# Patient Record
Sex: Female | Born: 1940 | Race: White | Hispanic: No | Marital: Married | Smoking: Never smoker
Health system: Southern US, Community
[De-identification: ages and names within clinical notes are randomized; demographics above are authoritative.]

## PROBLEM LIST (undated history)

## (undated) DIAGNOSIS — E119 Type 2 diabetes mellitus without complications: Secondary | ICD-10-CM

## (undated) DIAGNOSIS — M1990 Unspecified osteoarthritis, unspecified site: Secondary | ICD-10-CM

## (undated) DIAGNOSIS — D649 Anemia, unspecified: Secondary | ICD-10-CM

## (undated) DIAGNOSIS — F329 Major depressive disorder, single episode, unspecified: Secondary | ICD-10-CM

## (undated) DIAGNOSIS — R519 Headache, unspecified: Secondary | ICD-10-CM

## (undated) DIAGNOSIS — R51 Headache: Secondary | ICD-10-CM

## (undated) DIAGNOSIS — G459 Transient cerebral ischemic attack, unspecified: Secondary | ICD-10-CM

## (undated) DIAGNOSIS — K219 Gastro-esophageal reflux disease without esophagitis: Secondary | ICD-10-CM

## (undated) DIAGNOSIS — I639 Cerebral infarction, unspecified: Secondary | ICD-10-CM

## (undated) DIAGNOSIS — I10 Essential (primary) hypertension: Secondary | ICD-10-CM

## (undated) DIAGNOSIS — F32A Depression, unspecified: Secondary | ICD-10-CM

## (undated) HISTORY — PX: COLONOSCOPY: SHX174

## (undated) HISTORY — PX: TUBAL LIGATION: SHX77

## (undated) HISTORY — PX: HERNIA REPAIR: SHX51

## (undated) HISTORY — PX: ABDOMINAL HYSTERECTOMY: SHX81

## (undated) HISTORY — PX: ESOPHAGOGASTRODUODENOSCOPY: SHX1529

## (undated) HISTORY — PX: CHOLECYSTECTOMY: SHX55

## (undated) HISTORY — PX: ROTATOR CUFF REPAIR: SHX139

## (undated) HISTORY — PX: CATARACT EXTRACTION: SUR2

---

## 2004-07-19 ENCOUNTER — Ambulatory Visit: Payer: Self-pay | Admitting: Obstetrics and Gynecology

## 2005-02-01 ENCOUNTER — Ambulatory Visit: Payer: Self-pay | Admitting: Gastroenterology

## 2006-07-12 ENCOUNTER — Ambulatory Visit: Payer: Self-pay | Admitting: Unknown Physician Specialty

## 2007-07-21 ENCOUNTER — Ambulatory Visit: Payer: Self-pay | Admitting: Internal Medicine

## 2009-05-04 ENCOUNTER — Ambulatory Visit: Payer: Self-pay | Admitting: Internal Medicine

## 2009-12-21 ENCOUNTER — Ambulatory Visit: Payer: Self-pay | Admitting: Family Medicine

## 2009-12-31 ENCOUNTER — Ambulatory Visit: Payer: Self-pay | Admitting: Family Medicine

## 2010-01-13 ENCOUNTER — Ambulatory Visit: Payer: Self-pay | Admitting: Family Medicine

## 2010-05-17 ENCOUNTER — Ambulatory Visit: Payer: Self-pay | Admitting: Neurology

## 2010-12-06 ENCOUNTER — Ambulatory Visit: Payer: Self-pay | Admitting: Family Medicine

## 2011-01-15 ENCOUNTER — Ambulatory Visit: Payer: Self-pay | Admitting: Unknown Physician Specialty

## 2011-03-13 ENCOUNTER — Ambulatory Visit: Payer: Self-pay | Admitting: Family Medicine

## 2011-04-18 ENCOUNTER — Ambulatory Visit: Payer: Self-pay | Admitting: Gastroenterology

## 2011-06-08 ENCOUNTER — Ambulatory Visit: Payer: Self-pay | Admitting: Gastroenterology

## 2011-06-12 LAB — PATHOLOGY REPORT

## 2012-03-14 ENCOUNTER — Ambulatory Visit: Payer: Self-pay | Admitting: Family Medicine

## 2012-04-10 ENCOUNTER — Ambulatory Visit: Payer: Self-pay | Admitting: Family Medicine

## 2012-04-15 ENCOUNTER — Ambulatory Visit: Payer: Self-pay

## 2012-04-24 ENCOUNTER — Ambulatory Visit: Payer: Self-pay | Admitting: Internal Medicine

## 2013-03-21 ENCOUNTER — Ambulatory Visit: Payer: Self-pay | Admitting: Family Medicine

## 2013-07-14 ENCOUNTER — Ambulatory Visit: Payer: Self-pay | Admitting: Vascular Surgery

## 2013-10-07 ENCOUNTER — Ambulatory Visit: Payer: Self-pay | Admitting: Ophthalmology

## 2013-10-13 ENCOUNTER — Ambulatory Visit: Payer: Self-pay | Admitting: Ophthalmology

## 2013-10-23 ENCOUNTER — Ambulatory Visit: Payer: Self-pay | Admitting: Ophthalmology

## 2013-10-28 ENCOUNTER — Ambulatory Visit: Payer: Self-pay | Admitting: Ophthalmology

## 2014-01-07 DIAGNOSIS — F32A Depression, unspecified: Secondary | ICD-10-CM | POA: Insufficient documentation

## 2014-01-07 DIAGNOSIS — K219 Gastro-esophageal reflux disease without esophagitis: Secondary | ICD-10-CM | POA: Insufficient documentation

## 2014-01-07 DIAGNOSIS — F329 Major depressive disorder, single episode, unspecified: Secondary | ICD-10-CM | POA: Insufficient documentation

## 2014-03-24 ENCOUNTER — Ambulatory Visit: Payer: Self-pay | Admitting: Family Medicine

## 2014-09-09 ENCOUNTER — Ambulatory Visit: Payer: Self-pay | Admitting: Family Medicine

## 2014-12-05 NOTE — Op Note (Signed)
PATIENT NAME:  Vicki Norris, Vicki Norris MR#:  [id number] DATE OF BIRTH:  1941/02/01  DATE OF PROCEDURE:  10/13/2013  PREOPERATIVE DIAGNOSIS: Visually significant cataract of the right eye.   POSTOPERATIVE DIAGNOSIS: Visually significant cataract of the right eye.   OPERATIVE PROCEDURE: Cataract extraction by phacoemulsification with implant of intraocular lens to right eye.   SURGEON: Birder Robson, MD.   ANESTHESIA:  1. Managed anesthesia care.  2. Topical tetracaine drops followed by 2% Xylocaine jelly applied in the preoperative holding area.   COMPLICATIONS: None.   TECHNIQUE:  Stop and chop.  DESCRIPTION OF PROCEDURE: The patient was examined and consented in the preoperative holding area where the aforementioned topical anesthesia was applied to the right eye and then brought back to the Operating Room where the right eye was prepped and draped in the usual sterile ophthalmic fashion and a lid speculum was placed. A paracentesis was created with the side port blade and the anterior chamber was filled with viscoelastic. A near clear corneal incision was performed with the steel keratome. A continuous curvilinear capsulorrhexis was performed with a cystotome followed by the capsulorrhexis forceps. Hydrodissection and hydrodelineation were carried out with BSS on a blunt cannula. The lens was removed in a stop and chop technique and the remaining cortical material was removed with the irrigation-aspiration handpiece. The capsular bag was inflated with viscoelastic and the Tecnis ZCB00 26.0-diopter lens, serial number [id number] was placed in the capsular bag without complication. The remaining viscoelastic was removed from the eye with the irrigation-aspiration handpiece. The wounds were hydrated. The anterior chamber was flushed with Miostat and the eye was inflated to physiologic pressure. 0.1 mL of cefuroxime concentration 10 mg/mL was placed in the anterior chamber. The wounds were found to be  water tight. The eye was dressed with Vigamox. The patient was given protective glasses to wear throughout the day and a shield with which to sleep tonight. The patient was also given drops with which to begin a drop regimen today and will follow-up with me in one day.    ____________________________ Livingston Diones. Jasdeep Kepner, MD wlp:dmm D: 10/13/2013 16:52:06 ET T: 10/14/2013 10:35:27 ET JOB#: [id number]  cc: Allante Whitmire L. Tashika Goodin, MD, <Dictator> Livingston Diones Henderson Frampton MD ELECTRONICALLY SIGNED 10/16/2013 9:38

## 2014-12-05 NOTE — Op Note (Signed)
PATIENT NAME:  Vicki Norris, Vicki Norris MR#:  [id number] DATE OF BIRTH:  12/02/1940  DATE OF PROCEDURE:  10/28/2013  PREOPERATIVE DIAGNOSIS: Visually significant cataract of the left eye.   POSTOPERATIVE DIAGNOSIS: Visually significant cataract of the left eye.   OPERATIVE PROCEDURE: Cataract extraction by phacoemulsification with implant of intraocular lens to the left eye.   SURGEON: Birder Robson, MD.   ANESTHESIA:  1. Managed anesthesia care.  2. Topical tetracaine drops followed by 2% Xylocaine jelly applied in the preoperative holding area.   COMPLICATIONS: None.   TECHNIQUE:  Stop and chop.    DESCRIPTION OF PROCEDURE: The patient was examined and consented in the preoperative holding area where the aforementioned topical anesthesia was applied to the left eye and then brought back to the Operating Room where the left eye was prepped and draped in the usual sterile ophthalmic fashion and a lid speculum was placed. A paracentesis was created with the side port blade and the anterior chamber was filled with viscoelastic. A near clear corneal incision was performed with the steel keratome. A continuous curvilinear capsulorrhexis was performed with a cystotome followed by the capsulorrhexis forceps. Hydrodissection and hydrodelineation were carried out with BSS on a blunt cannula. The lens was removed in a stop and chop technique and the remaining cortical material was removed with the irrigation-aspiration handpiece. The capsular bag was inflated with viscoelastic and the Tecnis ZCB00 22.5-diopter lens, serial number [id number] was placed in the capsular bag without complication. The remaining viscoelastic was removed from the eye with the irrigation-aspiration handpiece. The wounds were hydrated. The anterior chamber was flushed with Miostat and the eye was inflated to physiologic pressure. 0.1 mL of cefuroxime concentration 10 mg/mL was placed in the anterior chamber. The wounds were found to be  water tight. The eye was dressed with Vigamox. The patient was given protective glasses to wear throughout the day and a shield with which to sleep tonight. The patient was also given drops with which to begin a drop regimen today and will follow-up with me in one day.   ____________________________ Livingston Diones. Kyndall Chaplin, MD wlp:gb D: 10/28/2013 22:45:16 ET T: 10/29/2013 04:46:58 ET JOB#: [id number]  cc: Shamonique Battiste L. Tawanda Schall, MD, <Dictator> Livingston Diones Eryka Dolinger MD ELECTRONICALLY SIGNED 10/30/2013 9:11

## 2015-09-12 ENCOUNTER — Ambulatory Visit
Admission: EM | Admit: 2015-09-12 | Discharge: 2015-09-12 | Disposition: A | Discharge status: Home / Self Care | Payer: Medicare PPO | Attending: Family Medicine | Admitting: Family Medicine

## 2015-09-12 ENCOUNTER — Encounter: Payer: Self-pay | Admitting: Gynecology

## 2015-09-12 DIAGNOSIS — H6593 Unspecified nonsuppurative otitis media, bilateral: Secondary | ICD-10-CM | POA: Diagnosis not present

## 2015-09-12 DIAGNOSIS — J0101 Acute recurrent maxillary sinusitis: Secondary | ICD-10-CM | POA: Diagnosis not present

## 2015-09-12 DIAGNOSIS — J209 Acute bronchitis, unspecified: Secondary | ICD-10-CM

## 2015-09-12 HISTORY — DX: Headache: R51

## 2015-09-12 HISTORY — DX: Transient cerebral ischemic attack, unspecified: G45.9

## 2015-09-12 HISTORY — DX: Essential (primary) hypertension: I10

## 2015-09-12 HISTORY — DX: Headache, unspecified: R51.9

## 2015-09-12 HISTORY — DX: Gastro-esophageal reflux disease without esophagitis: K21.9

## 2015-09-12 HISTORY — DX: Depression, unspecified: F32.A

## 2015-09-12 HISTORY — DX: Major depressive disorder, single episode, unspecified: F32.9

## 2015-09-12 HISTORY — DX: Anemia, unspecified: D64.9

## 2015-09-12 HISTORY — DX: Unspecified osteoarthritis, unspecified site: M19.90

## 2015-09-12 MED ORDER — SALINE SPRAY 0.65 % NA SOLN: 2.0000 | NASAL | Status: DC

## 2015-09-12 MED ORDER — ACETAMINOPHEN 500 MG PO TABS
1000.0000 mg | ORAL_TABLET | Freq: Once | ORAL | Status: AC
  Administered 2015-09-12: 1000 mg via ORAL

## 2015-09-12 MED ORDER — CEFDINIR 300 MG PO CAPS: 300.0000 mg | ORAL_CAPSULE | Freq: Two times a day (BID) | ORAL | Status: AC

## 2015-09-12 MED ORDER — PREDNISONE 20 MG PO TABS: 40.0000 mg | ORAL_TABLET | Freq: Every day | ORAL | Status: DC

## 2015-09-12 MED ORDER — ACETAMINOPHEN 500 MG PO TABS: 1000.0000 mg | ORAL_TABLET | Freq: Four times a day (QID) | ORAL | Status: AC | PRN

## 2015-09-12 NOTE — Discharge Instructions (Signed)
Earache An earache, also called otalgia, can be caused by many things. Pain from an earache can be sharp, dull, or burning. The pain may be temporary or constant. Earaches can be caused by problems with the ear, such as infection in either the middle ear or the ear canal, injury, impacted ear wax, middle ear pressure, or a foreign body in the ear. Ear pain can also result from problems in other areas. This is called referred pain. For example, pain can come from a sore throat, a tooth infection, or problems with the jaw or the joint between the jaw and the skull (temporomandibular joint, or TMJ). The cause of an earache is not always easy to identify. Watchful waiting may be appropriate for some earaches until a clear cause of the pain can be found. HOME CARE INSTRUCTIONS Watch your condition for any changes. The following actions may help to lessen any discomfort that you are feeling:  Take medicines only as directed by your health care provider. This includes ear drops.  Apply ice to your outer ear to help reduce pain.  Put ice in a plastic bag.  Place a towel between your skin and the bag.  Leave the ice on for 20 minutes, 2-3 times per day.  Do not put anything in your ear other than medicine that is prescribed by your health care provider.  Try resting in an upright position instead of lying down. This may help to reduce pressure in the middle ear and relieve pain.  Chew gum if it helps to relieve your ear pain.  Control any allergies that you have.  Keep all follow-up visits as directed by your health care provider. This is important. SEEK MEDICAL CARE IF:  Your pain does not improve within 2 days.  You have a fever.  You have new or worsening symptoms. SEEK IMMEDIATE MEDICAL CARE IF:  You have a severe headache.  You have a stiff neck.  You have difficulty swallowing.  You have redness or swelling behind your ear.  You have drainage from your ear.  You have hearing  loss.  You feel dizzy.   This information is not intended to replace advice given to you by your health care provider. Make sure you discuss any questions you have with your health care provider.   Document Released: 03/17/2004 Document Revised: 08/21/2014 Document Reviewed: 03/01/2014 Elsevier Interactive Patient Education 2016 [place] media is inflammation of your middle ear. This occurs when the auditory tube (eustachian tube) leading from the back of your nose (nasopharynx) to your eardrum is blocked. This blockage may result from a cold, environmental allergies, or an upper respiratory infection. Unresolved barotitis media may lead to damage or hearing loss (barotrauma), which may become permanent. HOME CARE INSTRUCTIONS   Use medicines as recommended by your health care provider. Over-the-counter medicines will help unblock the canal and can help during times of air travel.  Do not put anything into your ears to clean or unplug them. Eardrops will not be helpful.  Do not swim, dive, or fly until your health care provider says it is all right to do so. If these activities are necessary, chewing gum with frequent, forceful swallowing may help. It is also helpful to hold your nose and gently blow to pop your ears for equalizing pressure changes. This forces air into the eustachian tube.  Only take over-the-counter or prescription medicines for pain, discomfort, or fever as directed by your health care provider.  A  decongestant may be helpful in decongesting the middle ear and make pressure equalization easier. SEEK MEDICAL CARE IF:  You experience a serious form of dizziness in which you feel as if the room is spinning and you feel nauseated (vertigo).  Your symptoms only involve one ear. SEEK IMMEDIATE MEDICAL CARE IF:   You develop a severe headache, dizziness, or severe ear pain.  You have bloody or pus-like drainage from your ears.  You develop  a fever.  Your problems do not improve or become worse. MAKE SURE YOU:   Understand these instructions.  Will watch your condition.  Will get help right away if you are not doing well or get worse.   This information is not intended to replace advice given to you by your health care provider. Make sure you discuss any questions you have with your health care provider.   Document Released: 07/28/2000 Document Revised: 05/21/2013 Document Reviewed: 02/25/2013 Elsevier Interactive Patient Education 2016 [place]. Otitis Media With Effusion Otitis media with effusion is the presence of fluid in the middle ear. This is a common problem in children, which often follows ear infections. It may be present for weeks or longer after the infection. Unlike an acute ear infection, otitis media with effusion refers only to fluid behind the ear drum and not infection. Children with repeated ear and sinus infections and allergy problems are the most likely to get otitis media with effusion. CAUSES  The most frequent cause of the fluid buildup is dysfunction of the eustachian tubes. These are the tubes that drain fluid in the ears to the back of the nose (nasopharynx). SYMPTOMS   The main symptom of this condition is hearing loss. As a result, you or your child may:  Listen to the TV at a loud volume.  Not respond to questions.  Ask "what" often when spoken to.  Mistake or confuse one sound or word for another.  There may be a sensation of fullness or pressure but usually not pain. DIAGNOSIS   Your health care provider will diagnose this condition by examining you or your child's ears.  Your health care provider may test the pressure in you or your child's ear with a tympanometer.  A hearing test may be conducted if the problem persists. TREATMENT   Treatment depends on the duration and the effects of the effusion.  Antibiotics, decongestants, nose drops, and cortisone-type drugs  (tablets or nasal spray) may not be helpful.  Children with persistent ear effusions may have delayed language or behavioral problems. Children at risk for developmental delays in hearing, learning, and speech may require referral to a specialist earlier than children not at risk.  You or your child's health care provider may suggest a referral to an ear, nose, and throat surgeon for treatment. The following may help restore normal hearing:  Drainage of fluid.  Placement of ear tubes (tympanostomy tubes).  Removal of adenoids (adenoidectomy). HOME CARE INSTRUCTIONS   Avoid secondhand smoke.  Infants who are breastfed are less likely to have this condition.  Avoid feeding infants while they are lying flat.  Avoid known environmental allergens.  Avoid people who are sick. SEEK MEDICAL CARE IF:   Hearing is not better in 3 months.  Hearing is worse.  Ear pain.  Drainage from the ear.  Dizziness. MAKE SURE YOU:   Understand these instructions.  Will watch your condition.  Will get help right away if you are not doing well or get worse.  This information is not intended to replace advice given to you by your health care provider. Make sure you discuss any questions you have with your health care provider.   Document Released: 09/07/2004 Document Revised: 08/21/2014 Document Reviewed: 02/25/2013 Elsevier Interactive Patient Education 2016 [employer] Sinusitis, Adult Sinusitis is redness, soreness, and inflammation of the paranasal sinuses. Paranasal sinuses are air pockets within the bones of your face. They are located beneath your eyes, in the middle of your forehead, and above your eyes. In healthy paranasal sinuses, mucus is able to drain out, and air is able to circulate through them by way of your nose. However, when your paranasal sinuses are inflamed, mucus and air can become trapped. This can allow bacteria and other germs to grow and cause infection. Sinusitis  can develop quickly and last only a short time (acute) or continue over a long period (chronic). Sinusitis that lasts for more than 12 weeks is considered chronic. CAUSES Causes of sinusitis include:  Allergies.  Structural abnormalities, such as displacement of the cartilage that separates your nostrils (deviated septum), which can decrease the air flow through your nose and sinuses and affect sinus drainage.  Functional abnormalities, such as when the small hairs (cilia) that line your sinuses and help remove mucus do not work properly or are not present. SIGNS AND SYMPTOMS Symptoms of acute and chronic sinusitis are the same. The primary symptoms are pain and pressure around the affected sinuses. Other symptoms include:  Upper toothache.  Earache.  Headache.  Bad breath.  Decreased sense of smell and taste.  A cough, which worsens when you are lying flat.  Fatigue.  Fever.  Thick drainage from your nose, which often is green and may contain pus (purulent).  Swelling and warmth over the affected sinuses. DIAGNOSIS Your health care provider will perform a physical exam. During your exam, your health care provider may perform any of the following to help determine if you have acute sinusitis or chronic sinusitis:  Look in your nose for signs of abnormal growths in your nostrils (nasal polyps).  Tap over the affected sinus to check for signs of infection.  View the inside of your sinuses using an imaging device that has a light attached (endoscope). If your health care provider suspects that you have chronic sinusitis, one or more of the following tests may be recommended:  Allergy tests.  Nasal culture. A sample of mucus is taken from your nose, sent to a lab, and screened for bacteria.  Nasal cytology. A sample of mucus is taken from your nose and examined by your health care provider to determine if your sinusitis is related to an allergy. TREATMENT Most cases of  acute sinusitis are related to a viral infection and will resolve on their own within 10 days. Sometimes, medicines are prescribed to help relieve symptoms of both acute and chronic sinusitis. These may include pain medicines, decongestants, nasal steroid sprays, or saline sprays. However, for sinusitis related to a bacterial infection, your health care provider will prescribe antibiotic medicines. These are medicines that will help kill the bacteria causing the infection. Rarely, sinusitis is caused by a fungal infection. In these cases, your health care provider will prescribe antifungal medicine. For some cases of chronic sinusitis, surgery is needed. Generally, these are cases in which sinusitis recurs more than 3 times per year, despite other treatments. HOME CARE INSTRUCTIONS  Drink plenty of water. Water helps thin the mucus so your sinuses can drain more easily.  Use a humidifier.  Inhale steam 3-4 times a day (for example, sit in the bathroom with the shower running).  Apply a warm, moist washcloth to your face 3-4 times a day, or as directed by your health care provider.  Use saline nasal sprays to help moisten and clean your sinuses.  Take medicines only as directed by your health care provider.  If you were prescribed either an antibiotic or antifungal medicine, finish it all even if you start to feel better. SEEK IMMEDIATE MEDICAL CARE IF:  You have increasing pain or severe headaches.  You have nausea, vomiting, or drowsiness.  You have swelling around your face.  You have vision problems.  You have a stiff neck.  You have difficulty breathing.   This information is not intended to replace advice given to you by your health care provider. Make sure you discuss any questions you have with your health care provider.   Document Released: 07/31/2005 Document Revised: 08/21/2014 Document Reviewed: 08/15/2011 Elsevier Interactive Patient Education [employer].

## 2015-09-12 NOTE — ED Notes (Signed)
Patient c/o of left sider lower jaw pain / ear pain / neck pain/ back of left ear pain x 2 months.. Per patient was seen by her PCP on 09/06/2015 ( 6 days ago) and was told she had fluid in her left ear and sore throat. Patient stated was given Rx AMOX 875-125 mg take one tablet BID for 10 days #20. Per patient not helping.

## 2015-09-12 NOTE — ED Provider Notes (Signed)
CSN: [id number]     Arrival date & time 09/12/15  [id number] History   First MD Initiated Contact with Patient 09/12/15 [phone]     Chief Complaint  Patient presents with  . Facial Pain   (Consider location/radiation/quality/duration/timing/severity/associated sxs/prior Treatment) HPI Comments: Married caucasian female saw PCM started on augmentin not helping.  Left ear pain/jaw pain/headache.  Same thing last year typically annually.  Left side face feels swollen.  Gums swollen and hurting.  "eye" tooth pulled lower jaw 5-6 years ago patient concerns that something may have gotten lodged in gums causing these recurrent problems  Post nasal drip nonproductive cough eyeglasses tight left side; headaches across eyebrows that has stopped  Primary caretaker for spouse  FHx F-heart failure Brother pancreatic cancer  The history is provided by the patient.    Past Medical History  Diagnosis Date  . Hypertension   . Depression   . GERD (gastroesophageal reflux disease)   . Osteoarthritis   . TIA (transient ischemic attack)   . Anemia   . Head ache    Past Surgical History  Procedure Laterality Date  . Abdominal hysterectomy    . Tubal ligation    . Cataract extraction Bilateral   . Rotator cuff repair     No family history on file. Social History  Substance Use Topics  . Smoking status: Never Smoker   . Smokeless tobacco: None  . Alcohol Use: 0.6 oz/week    1 Cans of beer per week     Comment: occassion   OB History    No data available     Review of Systems  Constitutional: Negative for fever, chills, diaphoresis, activity change, appetite change, fatigue and unexpected weight change.  HENT: Positive for ear pain, facial swelling, postnasal drip and sinus pressure. Negative for congestion, dental problem, drooling, ear discharge, hearing loss, mouth sores, nosebleeds, rhinorrhea, sneezing, sore throat, tinnitus, trouble swallowing and voice change.   Eyes: Negative for photophobia,  pain, discharge, redness, itching and visual disturbance.  Respiratory: Positive for cough. Negative for choking, chest tightness, shortness of breath, wheezing and stridor.   Cardiovascular: Negative for chest pain, palpitations and leg swelling.  Gastrointestinal: Negative for nausea, vomiting, abdominal pain, diarrhea, constipation, blood in stool and abdominal distention.  Endocrine: Negative for cold intolerance and heat intolerance.  Genitourinary: Negative for dysuria, hematuria and difficulty urinating.  Musculoskeletal: Negative for myalgias, back pain, joint swelling, arthralgias, gait problem, neck pain and neck stiffness.  Skin: Negative for color change, pallor, rash and wound.  Allergic/Immunologic: Positive for environmental allergies. Negative for food allergies.  Neurological: Positive for headaches. Negative for dizziness, tremors, seizures, syncope, facial asymmetry, speech difficulty, weakness, light-headedness and numbness.  Hematological: Negative for adenopathy. Does not bruise/bleed easily.  Psychiatric/Behavioral: Positive for sleep disturbance. Negative for behavioral problems, confusion and agitation.    Allergies  Pantoprazole  Home Medications   Prior to Admission medications   Medication Sig Start Date End Date Taking? Authorizing Provider  ascorbic acid (VITAMIN C) 250 MG CHEW Chew 250 mg by mouth daily.   Yes Historical Provider, MD  aspirin 81 MG tablet Take 81 mg by mouth daily.   Yes Historical Provider, MD  Carboxymethylcellul-Glycerin (OPTIVE) 0.5-0.9 % SOLN Apply to eye.   Yes Historical Provider, MD  cholecalciferol (VITAMIN D) 1000 units tablet Take 1,000 Units by mouth daily.   Yes Historical Provider, MD  Esomeprazole Magnesium (NEXIUM PO) Take 22.3 mg by mouth.   Yes Historical Provider, MD  GARLIC PO  Take by mouth.   Yes Historical Provider, MD  Omega-3 Fatty Acids (FISH OIL PO) Take 300 mg by mouth.   Yes Historical Provider, MD  acetaminophen  (TYLENOL) 500 MG tablet Take 2 tablets (1,000 mg total) by mouth every 6 (six) hours as needed for mild pain, moderate pain, fever or headache. 09/12/15 09/15/15  Olen Cordial, NP  cefdinir (OMNICEF) 300 MG capsule Take 1 capsule (300 mg total) by mouth 2 (two) times daily. 09/12/15 09/21/15  Olen Cordial, NP  predniSONE (DELTASONE) 20 MG tablet Take 2 tablets (40 mg total) by mouth daily with breakfast. 09/12/15   Olen Cordial, NP  sodium chloride (OCEAN) 0.65 % SOLN nasal spray Place 2 sprays into both nostrils every 2 (two) hours while awake. 09/12/15 09/19/15  Olen Cordial, NP   Meds Ordered and Administered this Visit   Medications  acetaminophen (TYLENOL) tablet 1,000 mg (1,000 mg Oral Given 09/12/15 0922)    BP 134/66 mmHg  Pulse 82  Temp(Src) 98.1 F (36.7 C) (Oral)  Resp 16  Ht 5\' 9"  (1.753 m)  Wt 192 lb (87.091 kg)  BMI 28.34 kg/m2  SpO2 97% No data found.   Physical Exam  Constitutional: She is oriented to person, place, and time. She appears well-developed and well-nourished. She is active and cooperative.  Non-toxic appearance. She does not have a sickly appearance. She appears ill. No distress.  Patient crying during H&P due to feeling poorly and facial pain/headache.  Had not taken any pain medications today at home has not been sleeping well  HENT:  Head: Normocephalic and atraumatic.    Right Ear: Hearing, external ear and ear canal normal. A middle ear effusion is present.  Left Ear: Hearing, external ear and ear canal normal. A middle ear effusion is present.  Nose: Mucosal edema and rhinorrhea present. No nose lacerations, sinus tenderness, nasal deformity, septal deviation or nasal septal hematoma. No epistaxis.  No foreign bodies. Right sinus exhibits maxillary sinus tenderness. Right sinus exhibits no frontal sinus tenderness. Left sinus exhibits maxillary sinus tenderness. Left sinus exhibits no frontal sinus tenderness.  Mouth/Throat: Uvula is  midline and mucous membranes are normal. Mucous membranes are not pale, not dry and not cyanotic. She does not have dentures. No oral lesions. No trismus in the jaw. Normal dentition. No dental abscesses, uvula swelling, lacerations or dental caries. Posterior oropharyngeal edema and posterior oropharyngeal erythema present. No oropharyngeal exudate or tonsillar abscesses.  Eyes: Conjunctivae, EOM and lids are normal. Pupils are equal, round, and reactive to light. Right eye exhibits no chemosis, no discharge, no exudate and no hordeolum. No foreign body present in the right eye. Left eye exhibits no chemosis, no discharge, no exudate and no hordeolum. No foreign body present in the left eye. Right conjunctiva is not injected. Right conjunctiva has no hemorrhage. Left conjunctiva is not injected. Left conjunctiva has no hemorrhage. No scleral icterus. Right eye exhibits normal extraocular motion and no nystagmus. Left eye exhibits normal extraocular motion and no nystagmus. Right pupil is round and reactive. Left pupil is round and reactive. Pupils are equal.  Neck: Trachea normal and normal range of motion. Neck supple. No tracheal tenderness, no spinous process tenderness and no muscular tenderness present. No rigidity. No tracheal deviation, no edema, no erythema and normal range of motion present. No thyroid mass and no thyromegaly present.  Cardiovascular: Normal rate, regular rhythm, S1 normal, S2 normal, normal heart sounds and intact distal pulses.  PMI is not  displaced.  Exam reveals no gallop and no friction rub.   No murmur heard. Pulmonary/Chest: Effort normal and breath sounds normal. No accessory muscle usage or stridor. No respiratory distress. She has no decreased breath sounds. She has no wheezes. She has no rhonchi. She has no rales. She exhibits no tenderness.  Abdominal: Soft. She exhibits no distension.  Musculoskeletal: Normal range of motion. She exhibits no edema or tenderness.        Right shoulder: Normal.       Left shoulder: Normal.       Right hip: Normal.       Left hip: Normal.       Right knee: Normal.       Left knee: Normal.       Cervical back: Normal.       Right hand: Normal.       Left hand: Normal.  Lymphadenopathy:       Head (right side): No submental, no submandibular, no tonsillar, no preauricular, no posterior auricular and no occipital adenopathy present.       Head (left side): No submental, no submandibular, no tonsillar, no preauricular, no posterior auricular and no occipital adenopathy present.    She has no cervical adenopathy.       Right cervical: No superficial cervical, no deep cervical and no posterior cervical adenopathy present.      Left cervical: No superficial cervical, no deep cervical and no posterior cervical adenopathy present.  Neurological: She is alert and oriented to person, place, and time. She has normal strength. She is not disoriented. She displays no atrophy and no tremor. No cranial nerve deficit or sensory deficit. She exhibits normal muscle tone. She displays no seizure activity. Coordination and gait normal. GCS eye subscore is 4. GCS verbal subscore is 5. GCS motor subscore is 6.  Skin: Skin is warm, dry and intact. No abrasion, no bruising, no burn, no ecchymosis, no laceration, no lesion, no petechiae and no rash noted. She is not diaphoretic. No cyanosis or erythema. No pallor. Nails show no clubbing.  Psychiatric: She has a normal mood and affect. Her speech is normal and behavior is normal. Judgment and thought content normal. Cognition and memory are normal.  Nursing note and vitals reviewed.   ED Course  Procedures (including critical care time)  Labs Review Labs Reviewed - No data to display  Imaging Review No results found.  [id number] Tylenol 1000mg  po x 1 administered by Jack Quarto RN.  [phone] patient feeling better no longer crying ready for discharge to go to pharmacy and pick up prescribed  medications.  MDM   1. Acute recurrent maxillary sinusitis   2. Otitis media with effusion, bilateral   3. Acute bronchitis, unspecified organism    augmentin x 96 hours not providing any relief will change to omnicef 300mg  po BID, prednisone 40mg  po daily x 5 days and tylenol 1000mg  po QID prn.  Tylenol next dose 1430 patient notified max dose 1000mg  per dose and max 4000mg  in 24 hours.  Nasal saline 2 sprays each nostril q2h prn congestion.  No evidence of systemic bacterial infection, non toxic and well hydrated.  I do not see where any further testing or imaging is necessary at this time.   I will suggest supportive care, rest, good hygiene and encourage the patient to take adequate fluids.  The patient is to return to clinic or EMERGENCY ROOM if symptoms worsen or change significantly e.g. Worsening facial pain/swelling, fever  greater than 100.5 after 48 hours on omnicef.  Discussed with patient prednisone may cause insomnia, decreased or increased appetite.  Exitcare handout on sinusitis given to patient.  Patient verbalized agreement and understanding of treatment plan and had no further questions at this time.   P2:  Hand washing and cover cough  Supportive treatment.   No evidence of invasive bacterial infection, non toxic and well hydrated.  This is most likely self limiting viral infection.  I do not see where any further testing or imaging is necessary at this time.   I will suggest supportive care, rest, good hygiene and encourage the patient to take adequate fluids.  The patient is to return to clinic or EMERGENCY ROOM if symptoms worsen or change significantly e.g. ear pain, fever, purulent discharge from ears or bleeding.  Exitcare handout on otitis media with effusion given to patient.  Patient verbalized agreement and understanding of treatment plan.  Related to post nasal drip discussed with patient goal to stop postnasal drip and airway inflammation should improve.  omnicef and  prednisone provide cross coverage for URI/inflammation.  Patient reported she would continue to use OTC cough suppressant.  Bronchitis simple, community acquired, may have started as viral (probably respiratory syncytial, parainfluenza, influenza, or adenovirus), but now evidence of acute purulent bronchitis with resultant bronchial edema and mucus formation.  Viruses are the most common cause of bronchial inflammation in otherwise healthy adults with acute bronchitis.  The appearance of sputum is not predictive of whether a bacterial infection is present.  Purulent sputum is most often caused by viral infections.  There are a small portion of those caused by non-viral agents being Mycoplamsa pneumonia.  Microscopic examination or C&S of sputum in the healthy adult with acute bronchitis is generally not helpful (usually negative or normal respiratory flora) other considerations being cough from upper respiratory tract infections, sinusitis or allergic syndromes (mild asthma or viral pneumonia).  Differential Diagnosis:  reactive airway disease (asthma, allergic aspergillosis (eosinophilia), chronic bronchitis, respiratory infection (Sinusitis, Common cold, pneumonia), congestive heart failure, reflux esophagitis, bronchogenic tumor, aspiration syndromes and/or exposure irritants/tobacco smoke.  In this case, there is no evidence of any invasive bacterial illness.  Most likely viral etiology so will hold on antibiotic treatment.  Advise supportive care with rest, encourage fluids, good hygiene and watch for any worsening symptoms.  If they were to develop:  come back to the office or go to the emergency room if after hours. Without high fever, severe dyspnea, lack of physical findings or other risk factors, I will hold on a chest radiograph and CBC at this time.  I discussed that approximately 50% of patients with acute bronchitis have a cough that lasts up to three weeks, and 25% for over a month.  Tylenol, one to  two tablets every four hours as needed for fever or myalgias.   No aspirin.  Patient instructed to follow up in one week or sooner if symptoms worsen. Patient verbalized agreement and understanding of treatment plan.  P2:  hand washing and cover cough      Olen Cordial, NP 09/13/15 [phone]

## 2015-09-20 DIAGNOSIS — R51 Headache: Secondary | ICD-10-CM

## 2015-09-20 DIAGNOSIS — R519 Headache, unspecified: Secondary | ICD-10-CM | POA: Insufficient documentation

## 2015-10-18 ENCOUNTER — Encounter: Payer: Self-pay | Admitting: *Deleted

## 2015-10-18 ENCOUNTER — Ambulatory Visit (INDEPENDENT_AMBULATORY_CARE_PROVIDER_SITE_OTHER): Payer: Medicare PPO

## 2015-10-18 ENCOUNTER — Ambulatory Visit
Admission: EM | Admit: 2015-10-18 | Discharge: 2015-10-18 | Disposition: A | Discharge status: Home / Self Care | Payer: Medicare PPO | Attending: Family Medicine | Admitting: Family Medicine

## 2015-10-18 DIAGNOSIS — J40 Bronchitis, not specified as acute or chronic: Secondary | ICD-10-CM | POA: Diagnosis not present

## 2015-10-18 DIAGNOSIS — J0101 Acute recurrent maxillary sinusitis: Secondary | ICD-10-CM

## 2015-10-18 LAB — RAPID INFLUENZA A&B ANTIGENS
Influenza A (ARMC): NEGATIVE
Influenza B (ARMC): NEGATIVE

## 2015-10-18 LAB — RAPID STREP SCREEN (MED CTR MEBANE ONLY): STREPTOCOCCUS, GROUP A SCREEN (DIRECT): NEGATIVE

## 2015-10-18 MED ORDER — BENZONATATE 100 MG PO CAPS: 100.0000 mg | ORAL_CAPSULE | Freq: Three times a day (TID) | ORAL | Status: AC | PRN

## 2015-10-18 MED ORDER — GUAIFENESIN-CODEINE 100-10 MG/5ML PO SOLN: 3.0000 mL | Freq: Every evening | ORAL | Status: AC | PRN

## 2015-10-18 MED ORDER — AZITHROMYCIN 250 MG PO TABS
ORAL_TABLET | ORAL | Status: AC
Start: 2015-10-18 — End: 2015-10-23

## 2015-10-18 NOTE — ED Provider Notes (Signed)
[facility]  ____________________________________________  Time seen: Approximately 9:17 AM  I have reviewed the triage vital signs and the nursing notes.   HISTORY  Chief Complaint Fever; Nasal Congestion; Cough; Headache  HPI Vicki Norris is a 75 y.o. female presents for the complaint of 4 days of runny nose, nasal congestion, sinus pressure, sinus drainage as well as nonproductive cough. Patient states that the cough is her biggest complaint as it is causing her to wake up in the middle the night. Patient states that the cough is annoying her as she is not able to cough up any phlegm but feels that she needs to.   Patient states that she has intermittently felt warm with chills but denies known fever. Reports continues to eat and drink well. Patient reports that she has had recurrent sinus infections in January of February of this year and states that she was just starting to finally get over being sick and then this started. Patient reports that her husband and her son are at home with similar. States frequently getting some greenish nasal drainage out when blowing nose, but again states that the cough is a dry cough. Patient states that she feels that overall sore from coughing so much and that she feels that she is pulling muscles. Reports most recent antibiotics were  Augmentin and Omnicef at the end of January.  Denies chest pain, shortness of breath, chest pain with deep breath, neck pain, back pain, rash, abdominal pain, dysuria, weakness or dizziness.    PCP: [facility]  Past Medical History  Diagnosis Date  . Hypertension   . Depression   . GERD (gastroesophageal reflux disease)   . Osteoarthritis   . TIA (transient ischemic attack)   . Anemia   . Head ache     There are no active problems to display for this patient.   Past Surgical History  Procedure Laterality Date  . Abdominal hysterectomy    . Tubal ligation    . Cataract extraction Bilateral    . Rotator cuff repair      Current Outpatient Rx  Name  Route  Sig  Dispense  Refill  . ascorbic acid (VITAMIN C) 250 MG CHEW   Oral   Chew 250 mg by mouth daily.         [name] aspirin 81 MG tablet   Oral   Take 81 mg by mouth daily.         . Carboxymethylcellul-Glycerin (OPTIVE) 0.5-0.9 % SOLN   Ophthalmic   Apply to eye.         . cholecalciferol (VITAMIN D) 1000 units tablet   Oral   Take 1,000 Units by mouth daily.         . Esomeprazole Magnesium (NEXIUM PO)   Oral   Take 22.3 mg by mouth.         [name] GARLIC PO   Oral   Take by mouth.         . Omega-3 Fatty Acids (FISH OIL PO)   Oral   Take 300 mg by mouth.         . predniSONE (DELTASONE) 20 MG tablet   Oral   Take 2 tablets (40 mg total) by mouth daily with breakfast.   10 tablet   0   . EXPIRED: sodium chloride (OCEAN) 0.65 % SOLN nasal spray   Each Nare   Place 2 sprays into both nostrils every 2 (two) hours while awake.  0     Allergies Pantoprazole  History reviewed. No pertinent family history.  Social History Social History  Substance Use Topics  . Smoking status: Never Smoker   . Smokeless tobacco: None  . Alcohol Use: 0.6 oz/week    1 Cans of beer per week     Comment: occassion    Review of Systems Constitutional: Positive chills. Eyes: No visual changes. ENT: Mild sore throat. Positive nasal congestion, sinus pressure, sinus drainage. Positive dry cough. Cardiovascular: Denies chest pain. Respiratory: Denies shortness of breath. Gastrointestinal: No abdominal pain.  No nausea, no vomiting.  No diarrhea.  No constipation. Genitourinary: Negative for dysuria. Musculoskeletal: Negative for back pain. Skin: Negative for rash. Neurological: Negative for headaches, focal weakness or numbness.  10-point ROS otherwise negative.  ____________________________________________   PHYSICAL EXAM:  VITAL SIGNS: ED Triage Vitals  Enc Vitals Group     BP 10/18/15 0909  150/68 mmHg     Pulse Rate 10/18/15 0909 77     Resp 10/18/15 0909 16     Temp 10/18/15 0909 98 F (36.7 C)     Temp Source 10/18/15 0909 Oral     SpO2 10/18/15 0909 94 %     Weight 10/18/15 0909 195 lb (88.451 kg)     Height 10/18/15 0909 5\' 9"  (1.753 m)     Head Cir --      Peak Flow --      Pain Score --      Pain Loc --      Pain Edu? --      Excl. in [place]? --    Today's Vitals   10/18/15 0909 10/18/15 1101   BP: 150/68 160/70   Pulse: 77 74   Temp: 98 F (36.7 C)    TempSrc: Oral    Resp: 16 16   Height: 5\' 9"  (1.753 m)    Weight: 195 lb (88.451 kg)    SpO2: 94% 97%   PainSc:       Constitutional: Alert and oriented. Well appearing and in no acute distress. Eyes: Conjunctivae are normal. PERRL. EOMI. Head: Atraumatic. Mild bilateral maxillary sinus tenderness to palpation, no frontal sinus tenderness to palpation. No swelling. No erythema.  Ears: no erythema, normal TMs bilaterally.   Nose:Nasal congestion is a mucous erythema and edema.  Mouth/Throat: Mucous membranes are moist. Mild pharyngeal erythema. No tonsillar swelling or exudate.  Neck: No stridor.  No cervical spine tenderness to palpation. Hematological/Lymphatic/Immunilogical: No cervical lymphadenopathy. Cardiovascular and chest: Normal rate, regular rhythm. Grossly normal heart sounds.  Good peripheral circulation. Chest nontender to palpation. Respiratory: Normal respiratory effort.  No retractions. Mild scattered rhonchi. No wheezes or rales. Good air movement. Speaks in complete sentences. Gastrointestinal: Soft and nontender. Normal Bowel sounds. No CVA tenderness. Musculoskeletal: No lower or upper extremity tenderness nor edema. No calf tenderness bilaterally. No cervical, thoracic or lumbar tenderness to palpation. Ambulatory in room and changes positions quickly with steady gait. Neurologic:  Normal speech and language. No gross focal neurologic deficits are appreciated. No gait instability. Skin:   Skin is warm, dry and intact. No rash noted. Psychiatric: Mood and affect are normal. Speech and behavior are normal.  ____________________________________________   LABS (all labs ordered are listed, but only abnormal results are displayed)  Labs Reviewed  RAPID INFLUENZA A&B ANTIGENS ([facility] ONLY)  RAPID STREP SCREEN (NOT AT [facility])  CULTURE, GROUP A STREP [facility])   RADIOLOGY  EXAM: CHEST 2 VIEW  COMPARISON: None.  FINDINGS: Cardiac silhouette  normal in size. Thoracic aorta mildly tortuous atherosclerotic. Hilar and mediastinal contours otherwise unremarkable. Minimal linear scar or atelectasis in the left lower lobe. Lungs otherwise clear. No localized airspace consolidation. No pleural effusions. No pneumothorax. Normal pulmonary vascularity. Visualized bony thorax intact.  IMPRESSION: Minimal linear atelectasis or scar in the left lower lobe. No acute cardiopulmonary disease otherwise.   Electronically Signed By: Evangeline Dakin M.D. On: 10/18/2015 10:16      I, Marylene Land, personally viewed and evaluated these images (plain radiographs) as part of my medical decision making, as well as reviewing the written report by the radiologist.  ____________________________________________  INITIAL IMPRESSION / [place] / ED COURSE  Pertinent labs & imaging results that were available during my care of the patient were reviewed by me and considered in my medical decision making (see chart for details).  Well-appearing patient. No acute distress. Presents for the complaints of 4 days of nasal congestion, nasal drainage, cough, sinus pressure. Patient states that the cough is the biggest complaint as it is a dry hacking cough. Denies chest pain, shortness of breath, abdominal pain or pain with deep breath. Lungs with mild scattered rhonchi. No wheezing or rales. Good air movement. Abdomen soft and nontender. Mild maxillary sinus tenderness. Patient reports  recent sinus infections as well as respiratory infections. Will evaluate chest x-ray. Reports sick contacts at home. Discussed evaluating labs with patient, states she does not want labs completed at this time.   Chest xray minimal linear atelectasis or scar in the left lower lobe, no acute cardiopulmonary disease otherwise per radiologist. Strep and influenza negative. Discussed  xray findings with patient, as with other comorbidities and recent sickness will treat bronchitis and mild recurrent sinusitis with oral azithromycin, prn tessalon perles during day and prn codeine with guaifenesin at night. Reports has taken codeine in past and tolerated well. Discussed indication, risks and benefits of medications with patient.  Discussed follow up with Primary care physician this week. Discussed follow up and return parameters including no resolution or any worsening concerns. Patient verbalized understanding and agreed to plan.    ____________________________________________   FINAL CLINICAL IMPRESSION(S) / ED DIAGNOSES  Final diagnoses:  Bronchitis  Recurrent maxillary sinusitis, unspecified chronicity      Note: This dictation was prepared with Dragon dictation along with smaller phrase technology. Any transcriptional errors that result from this process are unintentional.    Marylene Land, NP 10/18/15 [place], NP 10/18/15 1137

## 2015-10-18 NOTE — ED Notes (Signed)
Fever, runny nose, head congestion, non-productive cough, headache, and chest pain with cough since Friday.

## 2015-10-18 NOTE — Discharge Instructions (Signed)
Take medication as prescribed. Rest. Drink plenty of fluids.   Follow up with your primary care physician. Return to Urgent care for new or worsening concerns.     Sinusitis, Adult Sinusitis is redness, soreness, and inflammation of the paranasal sinuses. Paranasal sinuses are air pockets within the bones of your face. They are located beneath your eyes, in the middle of your forehead, and above your eyes. In healthy paranasal sinuses, mucus is able to drain out, and air is able to circulate through them by way of your nose. However, when your paranasal sinuses are inflamed, mucus and air can become trapped. This can allow bacteria and other germs to grow and cause infection. Sinusitis can develop quickly and last only a short time (acute) or continue over a long period (chronic). Sinusitis that lasts for more than 12 weeks is considered chronic. CAUSES Causes of sinusitis include:  Allergies.  Structural abnormalities, such as displacement of the cartilage that separates your nostrils (deviated septum), which can decrease the air flow through your nose and sinuses and affect sinus drainage.  Functional abnormalities, such as when the small hairs (cilia) that line your sinuses and help remove mucus do not work properly or are not present. SIGNS AND SYMPTOMS Symptoms of acute and chronic sinusitis are the same. The primary symptoms are pain and pressure around the affected sinuses. Other symptoms include:  Upper toothache.  Earache.  Headache.  Bad breath.  Decreased sense of smell and taste.  A cough, which worsens when you are lying flat.  Fatigue.  Fever.  Thick drainage from your nose, which often is green and may contain pus (purulent).  Swelling and warmth over the affected sinuses. DIAGNOSIS Your health care provider will perform a physical exam. During your exam, your health care provider may perform any of the following to help determine if you have acute sinusitis or  chronic sinusitis:  Look in your nose for signs of abnormal growths in your nostrils (nasal polyps).  Tap over the affected sinus to check for signs of infection.  View the inside of your sinuses using an imaging device that has a light attached (endoscope). If your health care provider suspects that you have chronic sinusitis, one or more of the following tests may be recommended:  Allergy tests.  Nasal culture. A sample of mucus is taken from your nose, sent to a lab, and screened for bacteria.  Nasal cytology. A sample of mucus is taken from your nose and examined by your health care provider to determine if your sinusitis is related to an allergy. TREATMENT Most cases of acute sinusitis are related to a viral infection and will resolve on their own within 10 days. Sometimes, medicines are prescribed to help relieve symptoms of both acute and chronic sinusitis. These may include pain medicines, decongestants, nasal steroid sprays, or saline sprays. However, for sinusitis related to a bacterial infection, your health care provider will prescribe antibiotic medicines. These are medicines that will help kill the bacteria causing the infection. Rarely, sinusitis is caused by a fungal infection. In these cases, your health care provider will prescribe antifungal medicine. For some cases of chronic sinusitis, surgery is needed. Generally, these are cases in which sinusitis recurs more than 3 times per year, despite other treatments. HOME CARE INSTRUCTIONS  Drink plenty of water. Water helps thin the mucus so your sinuses can drain more easily.  Use a humidifier.  Inhale steam 3-4 times a day (for example, sit in the bathroom with  the shower running).  Apply a warm, moist washcloth to your face 3-4 times a day, or as directed by your health care provider.  Use saline nasal sprays to help moisten and clean your sinuses.  Take medicines only as directed by your health care provider.  If  you were prescribed either an antibiotic or antifungal medicine, finish it all even if you start to feel better. SEEK IMMEDIATE MEDICAL CARE IF:  You have increasing pain or severe headaches.  You have nausea, vomiting, or drowsiness.  You have swelling around your face.  You have vision problems.  You have a stiff neck.  You have difficulty breathing.   This information is not intended to replace advice given to you by your health care provider. Make sure you discuss any questions you have with your health care provider.   Document Released: 07/31/2005 Document Revised: 08/21/2014 Document Reviewed: 08/15/2011 Elsevier Interactive Patient Education [employer].

## 2015-10-20 LAB — CULTURE, GROUP A STREP (THRC)

## 2016-01-10 ENCOUNTER — Ambulatory Visit
Admission: EM | Admit: 2016-01-10 | Discharge: 2016-01-10 | Disposition: A | Discharge status: Home / Self Care | Payer: Medicare PPO | Attending: Family Medicine | Admitting: Family Medicine

## 2016-01-10 ENCOUNTER — Encounter: Payer: Self-pay | Admitting: *Deleted

## 2016-01-10 DIAGNOSIS — W228XXA Striking against or struck by other objects, initial encounter: Secondary | ICD-10-CM

## 2016-01-10 DIAGNOSIS — S01312A Laceration without foreign body of left ear, initial encounter: Secondary | ICD-10-CM | POA: Diagnosis not present

## 2016-01-10 MED ORDER — TETANUS-DIPHTH-ACELL PERTUSSIS 5-2.5-18.5 LF-MCG/0.5 IM SUSP
0.5000 mL | Freq: Once | INTRAMUSCULAR | Status: AC
  Administered 2016-01-10: 0.5 mL via INTRAMUSCULAR

## 2016-01-10 MED ORDER — HYDROCODONE-ACETAMINOPHEN 5-325 MG PO TABS: ORAL_TABLET | ORAL | Status: DC

## 2016-01-10 NOTE — ED Notes (Signed)
Pt struck on left ear by falling lawn ornamental. C/o laceration behind left ear at fold of ear. No LOC.

## 2016-01-10 NOTE — Discharge Instructions (Signed)

## 2016-03-01 NOTE — ED Provider Notes (Signed)
CSN: [id number]     Arrival date & time 01/10/16  1247 History   First MD Initiated Contact with Patient 01/10/16 1442     Chief Complaint  Patient presents with  . Ear Laceration   (Consider location/radiation/quality/duration/timing/severity/associated sxs/prior Treatment) HPI Comments: 75 yo female with a c/o left earlobe laceration after injured by falling lawn ornamental. Denies any loss of consciousness.  The history is provided by the patient.    Past Medical History  Diagnosis Date  . Hypertension   . Depression   . GERD (gastroesophageal reflux disease)   . Osteoarthritis   . TIA (transient ischemic attack)   . Anemia   . Head ache    Past Surgical History  Procedure Laterality Date  . Abdominal hysterectomy    . Tubal ligation    . Cataract extraction Bilateral   . Rotator cuff repair     History reviewed. No pertinent family history. Social History  Substance Use Topics  . Smoking status: Never Smoker   . Smokeless tobacco: None  . Alcohol Use: 0.6 oz/week    1 Cans of beer per week     Comment: occassion   OB History    No data available     Review of Systems  Allergies  Pantoprazole  Home Medications   Prior to Admission medications   Medication Sig Start Date End Date Taking? Authorizing Provider  ascorbic acid (VITAMIN C) 250 MG CHEW Chew 250 mg by mouth daily.   Yes Historical Provider, MD  aspirin 81 MG tablet Take 81 mg by mouth daily.   Yes Historical Provider, MD  Carboxymethylcellul-Glycerin (OPTIVE) 0.5-0.9 % SOLN Apply to eye.   Yes Historical Provider, MD  cholecalciferol (VITAMIN D) 1000 units tablet Take 1,000 Units by mouth daily.   Yes Historical Provider, MD  Esomeprazole Magnesium (NEXIUM PO) Take 22.3 mg by mouth.   Yes Historical Provider, MD  GARLIC PO Take by mouth.   Yes Historical Provider, MD  Omega-3 Fatty Acids (FISH OIL PO) Take 300 mg by mouth.   Yes Historical Provider, MD  HYDROcodone-acetaminophen (NORCO/VICODIN)  5-325 MG tablet Half to one tablet bid as needed 01/10/16   Norval Gable, MD  predniSONE (DELTASONE) 20 MG tablet Take 2 tablets (40 mg total) by mouth daily with breakfast. 09/12/15   Olen Cordial, NP  sodium chloride (OCEAN) 0.65 % SOLN nasal spray Place 2 sprays into both nostrils every 2 (two) hours while awake. 09/12/15 09/19/15  Olen Cordial, NP   Meds Ordered and Administered this Visit   Medications  Tdap (BOOSTRIX) injection 0.5 mL (0.5 mLs Intramuscular Given 01/10/16 1456)    BP 152/68 mmHg  Pulse 80  Temp(Src) 98.1 F (36.7 C) (Oral)  Resp 16  Ht 5\' 9"  (1.753 m)  Wt 195 lb (88.451 kg)  BMI 28.78 kg/m2  SpO2 97% No data found.   Physical Exam  Constitutional: She appears well-developed and well-nourished. No distress.  Skin: She is not diaphoretic.  Left ear 2cm laceration to posterior/superior aspect of earlobe  Nursing note and vitals reviewed.   ED Course  Procedures (including critical care time)  Labs Review Labs Reviewed - No data to display  Imaging Review No results found.   Visual Acuity Review  Right Eye Distance:   Left Eye Distance:   Bilateral Distance:    Right Eye Near:   Left Eye Near:    Bilateral Near:         MDM   1.  Laceration of ear, left, initial encounter    Discharge Medication List as of 01/10/2016  3:15 PM    START taking these medications   Details  HYDROcodone-acetaminophen (NORCO/VICODIN) 5-325 MG tablet Half to one tablet bid as needed, Print       1.  diagnosis reviewed with patient; wound cleaned and steri-strips and dermabond applied with wound closure; tolerated well 2. Recommend supportive treatment with wound care 3. Given tetanus vaccine  3. Follow-up prn if symptoms worsen or don't improve    Norval Gable, MD 03/01/16 2043

## 2016-03-09 ENCOUNTER — Other Ambulatory Visit: Payer: Self-pay | Admitting: Nurse Practitioner

## 2016-03-09 DIAGNOSIS — K449 Diaphragmatic hernia without obstruction or gangrene: Secondary | ICD-10-CM

## 2016-03-16 ENCOUNTER — Ambulatory Visit: Payer: Medicare PPO

## 2016-03-22 ENCOUNTER — Ambulatory Visit
Admission: RE | Admit: 2016-03-22 | Discharge: 2016-03-22 | Disposition: A | Discharge status: Home / Self Care | Payer: Medicare PPO | Source: Ambulatory Visit | Attending: Nurse Practitioner | Admitting: Nurse Practitioner

## 2016-03-22 DIAGNOSIS — K228 Other specified diseases of esophagus: Secondary | ICD-10-CM | POA: Insufficient documentation

## 2016-03-22 DIAGNOSIS — K219 Gastro-esophageal reflux disease without esophagitis: Secondary | ICD-10-CM | POA: Insufficient documentation

## 2016-03-22 DIAGNOSIS — K449 Diaphragmatic hernia without obstruction or gangrene: Secondary | ICD-10-CM | POA: Diagnosis present

## 2016-04-14 DIAGNOSIS — F339 Major depressive disorder, recurrent, unspecified: Secondary | ICD-10-CM | POA: Insufficient documentation

## 2016-06-01 ENCOUNTER — Ambulatory Visit: Admit: 2016-06-01 | Payer: Medicare PPO | Admitting: Gastroenterology

## 2016-06-01 SURGERY — EGD (ESOPHAGOGASTRODUODENOSCOPY)
Anesthesia: General

## 2016-06-08 DIAGNOSIS — E538 Deficiency of other specified B group vitamins: Secondary | ICD-10-CM | POA: Insufficient documentation

## 2016-06-08 DIAGNOSIS — E119 Type 2 diabetes mellitus without complications: Secondary | ICD-10-CM | POA: Insufficient documentation

## 2016-06-08 DIAGNOSIS — E7800 Pure hypercholesterolemia, unspecified: Secondary | ICD-10-CM | POA: Insufficient documentation

## 2016-06-19 DIAGNOSIS — K5909 Other constipation: Secondary | ICD-10-CM | POA: Insufficient documentation

## 2016-06-29 ENCOUNTER — Encounter
Admission: RE | Disposition: A | Discharge status: Home / Self Care | Payer: Self-pay | Source: Ambulatory Visit | Attending: Gastroenterology

## 2016-06-29 ENCOUNTER — Ambulatory Visit: Payer: Medicare PPO | Admitting: Anesthesiology

## 2016-06-29 ENCOUNTER — Ambulatory Visit
Admission: RE | Admit: 2016-06-29 | Discharge: 2016-06-29 | Disposition: A | Discharge status: Home / Self Care | Payer: Medicare PPO | Source: Ambulatory Visit | Attending: Gastroenterology | Admitting: Gastroenterology

## 2016-06-29 DIAGNOSIS — M1990 Unspecified osteoarthritis, unspecified site: Secondary | ICD-10-CM | POA: Diagnosis not present

## 2016-06-29 DIAGNOSIS — F329 Major depressive disorder, single episode, unspecified: Secondary | ICD-10-CM | POA: Insufficient documentation

## 2016-06-29 DIAGNOSIS — K2980 Duodenitis without bleeding: Secondary | ICD-10-CM | POA: Insufficient documentation

## 2016-06-29 DIAGNOSIS — E119 Type 2 diabetes mellitus without complications: Secondary | ICD-10-CM | POA: Insufficient documentation

## 2016-06-29 DIAGNOSIS — K449 Diaphragmatic hernia without obstruction or gangrene: Secondary | ICD-10-CM | POA: Insufficient documentation

## 2016-06-29 DIAGNOSIS — K317 Polyp of stomach and duodenum: Secondary | ICD-10-CM | POA: Diagnosis not present

## 2016-06-29 DIAGNOSIS — Z7982 Long term (current) use of aspirin: Secondary | ICD-10-CM | POA: Insufficient documentation

## 2016-06-29 DIAGNOSIS — K219 Gastro-esophageal reflux disease without esophagitis: Secondary | ICD-10-CM | POA: Diagnosis present

## 2016-06-29 DIAGNOSIS — Z8673 Personal history of transient ischemic attack (TIA), and cerebral infarction without residual deficits: Secondary | ICD-10-CM | POA: Insufficient documentation

## 2016-06-29 HISTORY — DX: Type 2 diabetes mellitus without complications: E11.9

## 2016-06-29 HISTORY — PX: ESOPHAGOGASTRODUODENOSCOPY (EGD) WITH PROPOFOL: SHX5813

## 2016-06-29 HISTORY — DX: Cerebral infarction, unspecified: I63.9

## 2016-06-29 LAB — GLUCOSE, CAPILLARY: GLUCOSE-CAPILLARY: 107 mg/dL — AB (ref 65–99)

## 2016-06-29 SURGERY — ESOPHAGOGASTRODUODENOSCOPY (EGD) WITH PROPOFOL
Anesthesia: General

## 2016-06-29 MED ORDER — SODIUM CHLORIDE 0.9 % IV SOLN
INTRAVENOUS | Status: DC
  Administered 2016-06-29 (×2): via INTRAVENOUS

## 2016-06-29 MED ORDER — SODIUM CHLORIDE 0.9 % IV SOLN: INTRAVENOUS | Status: DC

## 2016-06-29 MED ORDER — PROPOFOL 10 MG/ML IV BOLUS
INTRAVENOUS | Status: DC | PRN
  Administered 2016-06-29: 100 mg via INTRAVENOUS

## 2016-06-29 MED ORDER — MIDAZOLAM HCL 5 MG/5ML IJ SOLN
INTRAMUSCULAR | Status: DC | PRN
  Administered 2016-06-29: 1 mg via INTRAVENOUS

## 2016-06-29 MED ORDER — PROPOFOL 500 MG/50ML IV EMUL
INTRAVENOUS | Status: DC | PRN
  Administered 2016-06-29: 140 ug/kg/min via INTRAVENOUS

## 2016-06-29 MED ORDER — LIDOCAINE 2% (20 MG/ML) 5 ML SYRINGE
INTRAMUSCULAR | Status: DC | PRN
  Administered 2016-06-29: 40 mg via INTRAVENOUS

## 2016-06-29 MED ORDER — PHENYLEPHRINE HCL 10 MG/ML IJ SOLN
INTRAMUSCULAR | Status: DC | PRN
  Administered 2016-06-29 (×2): 100 ug via INTRAVENOUS

## 2016-06-29 MED ORDER — FENTANYL CITRATE (PF) 100 MCG/2ML IJ SOLN
INTRAMUSCULAR | Status: DC | PRN
  Administered 2016-06-29: 50 ug via INTRAVENOUS

## 2016-06-29 NOTE — H&P (Signed)
Outpatient short stay form Pre-procedure 06/29/2016 11:24 AM Lollie Sails MD  Primary Physician: Dr. Salome Holmes  Reason for visit:  EGD  History of present illness:  Patient is a 75 year old female presenting today as above. She has a complaint of hoarseness in the morning and will sometimes wake up at night with a lot of mucus that is difficult to swallow. As not regurgitating foods. She has had a dilatation in the past for dysphagia but currently she states this is not an issue for her. She does take 81 mg aspirin but has held that for several days. She takes no other aspirin products or blood thinning agents. She does take a daily proton pump inhibitor.   Current Facility-Administered Medications:  .  0.9 %  sodium chloride infusion, , Intravenous, Continuous, Lollie Sails, MD, Last Rate: 20 mL/hr at 06/29/16 1026 .  0.9 %  sodium chloride infusion, , Intravenous, Continuous, Lollie Sails, MD  Prescriptions Prior to Admission  Medication Sig Dispense Refill Last Dose  . ascorbic acid (VITAMIN C) 250 MG CHEW Chew 250 mg by mouth daily.   Past Month at Unknown time  . aspirin 81 MG tablet Take 81 mg by mouth daily.   06/27/2016 at Unknown time  . Carboxymethylcellul-Glycerin (OPTIVE) 0.5-0.9 % SOLN Apply to eye.   Past Week at Unknown time  . cyanocobalamin 1000 MCG tablet Take 1,000 mcg by mouth daily.   Past Week at Unknown time  . GARLIC PO Take by mouth.   Past Week at Unknown time  . glipiZIDE (GLUCOTROL XL) 2.5 MG 24 hr tablet Take 2.5 mg by mouth daily with breakfast.   06/28/2016 at Unknown time  . Omega-3 Fatty Acids (FISH OIL PO) Take 300 mg by mouth.   Past Week at Unknown time  . RABEprazole (ACIPHEX) 20 MG tablet Take 20 mg by mouth daily.   06/28/2016 at Unknown time  . cholecalciferol (VITAMIN D) 1000 units tablet Take 1,000 Units by mouth 2 (two) times daily.    01/09/2016 at Unknown time  . Esomeprazole Magnesium (NEXIUM PO) Take 22.3 mg by mouth.   Not  Taking at Unknown time  . HYDROcodone-acetaminophen (NORCO/VICODIN) 5-325 MG tablet Half to one tablet bid as needed 6 tablet 0   . predniSONE (DELTASONE) 20 MG tablet Take 2 tablets (40 mg total) by mouth daily with breakfast. (Patient not taking: Reported on 06/29/2016) 10 tablet 0 Completed Course at Unknown time  . sodium chloride (OCEAN) 0.65 % SOLN nasal spray Place 2 sprays into both nostrils every 2 (two) hours while awake.  0      Allergies  Allergen Reactions  . Metformin And Related     Constipation,diarrhea  . Pantoprazole Swelling     Past Medical History:  Diagnosis Date  . Anemia   . Depression   . Diabetes mellitus without complication ([place])   . GERD (gastroesophageal reflux disease)   . Head ache   . Osteoarthritis   . Stroke [facility])    TIA  . TIA (transient ischemic attack)     Review of systems:      Physical Exam    Heart and lungs: Regular rate and rhythm without rub or gallop, lungs are bilaterally clear.    HEENT: Normocephalic atraumatic eyes are anicteric    Other:     Pertinant exam for procedure: Soft nontender nondistended bowel sounds positive normoactive.    Planned proceedures: EGD and indicated procedures. I have discussed the risks benefits  and complications of procedures to include not limited to bleeding, infection, perforation and the risk of sedation and the patient wishes to proceed.    Lollie Sails, MD Gastroenterology 06/29/2016  11:24 AM

## 2016-06-29 NOTE — Op Note (Signed)
[facility] Gastroenterology Patient Name: Vicki Norris Procedure Date: 06/29/2016 11:13 AM MRN: [id number] Account #: [id number] Date of Birth: June 26, 1941 Admit Type: Outpatient Age: 75 Room: [facility] ENDO ROOM 1 Gender: Female Note Status: Finalized Procedure:            Upper GI endoscopy Indications:          Gastro-esophageal reflux disease, dysguesia Providers:            Lollie Sails, MD Referring MD:         Rubbie Battiest. Iona Beard MD, MD (Referring MD) Medicines:            Monitored Anesthesia Care Complications:        No immediate complications. Procedure:            Pre-Anesthesia Assessment:                       - ASA Grade Assessment: III - A patient with severe                        systemic disease.                       After obtaining informed consent, the endoscope was                        passed under direct vision. Throughout the procedure,                        the patient's blood pressure, pulse, and oxygen                        saturations were monitored continuously. The Endoscope                        was introduced through the mouth, and advanced to the                        third part of duodenum. The upper GI endoscopy was                        accomplished without difficulty. The patient tolerated                        the procedure well. Findings:      Abnormal motility was noted in the lower third of the esophagus. The       cricopharyngeus was normal. There are extra peristaltic waves in the       esophageal body. The distal esophagus/lower esophageal sphincter is       open. Tertiary peristaltic waves are noted. There is a sharp angulation       of the distal esophagus several cm above the GEJ.      LA Grade A (one or more mucosal breaks less than 5 mm, not extending       between tops of 2 mucosal folds) esophagitis with no bleeding was found.       Biopsies were taken with a cold forceps for histology.      Multiple 1  to 5 mm sessile polyps with no bleeding and no stigmata of       recent bleeding were found in the gastric body.  Biopsies were taken with       a cold forceps for histology.      A small hiatal hernia was present.      minimal/scant irritation at the posterior bulb, otherwise normal.      A single localized, 3 mm non-bleeding erosion was found at the incisura.       There were no stigmata of recent bleeding. Biopsies were taken with a       cold forceps for histology.      The cardia and gastric fundus were normal on retroflexion otherwise.      No endoscopic abnormality was evident in the esophagus to explain the       patient's complaint of dysphagia. It was decided, however, to proceed       with dilation of the lower third of the esophagus. A TTS dilator was       passed through the scope. Dilation with a 12-13.5-15 mm balloon dilator       was performed to 14.5 mm. Impression:           - Abnormal esophageal motility, consistent with                        presbyesophagus.                       - LA Grade A esophagitis. Biopsied.                       - Multiple gastric polyps. Biopsied.                       - Small hiatal hernia.                       - Non-bleeding erosive gastropathy. Biopsied. Recommendation:       - Discharge patient to home.                       - Use Aciphex (rabeprazole) 20 mg PO BID for 3 months.                       - Return to GI clinic in 1 month. Procedure Code(s):    --- Professional ---                       [phone], Esophagogastroduodenoscopy, flexible, transoral;                        with transendoscopic balloon dilation of esophagus                        (less than 30 mm diameter)                       43239, Esophagogastroduodenoscopy, flexible, transoral;                        with biopsy, single or multiple Diagnosis Code(s):    --- Professional ---                       K22.4, Dyskinesia of esophagus  K20.9,  Esophagitis, unspecified                       K31.7, Polyp of stomach and duodenum                       K44.9, Diaphragmatic hernia without obstruction or                        gangrene                       K31.89, Other diseases of stomach and duodenum                       K21.9, Gastro-esophageal reflux disease without                        esophagitis CPT copyright 2016 American Medical Association. All rights reserved. The codes documented in this report are preliminary and upon coder review may  be revised to meet current compliance requirements. Lollie Sails, MD 06/29/2016 12:14:21 PM This report has been signed electronically. Number of Addenda: 0 Note Initiated On: 06/29/2016 11:13 AM      [facility]

## 2016-06-29 NOTE — Anesthesia Preprocedure Evaluation (Signed)
Anesthesia Evaluation  Patient identified by MRN, date of birth, ID band Patient awake    Reviewed: Allergy & Precautions, NPO status , Patient's Chart, lab work & pertinent test results  History of Anesthesia Complications Negative for: history of anesthetic complications  Airway Mallampati: II  TM Distance: >3 FB Neck ROM: Full    Dental  (+) Upper Dentures   Pulmonary neg pulmonary ROS, neg sleep apnea, neg COPD,    breath sounds clear to auscultation- rhonchi (-) wheezing      Cardiovascular Exercise Tolerance: Good (-) hypertension(-) CAD and (-) Past MI  Rhythm:Regular Rate:Normal - Systolic murmurs and - Diastolic murmurs    Neuro/Psych  Headaches, Depression TIACVA, No Residual Symptoms    GI/Hepatic Neg liver ROS, GERD  ,  Endo/Other  diabetes, Type 2, Oral Hypoglycemic Agents  Renal/GU negative Renal ROS     Musculoskeletal  (+) Arthritis ,   Abdominal (+) - obese,   Peds  Hematology  (+) anemia ,   Anesthesia Other Findings Past Medical History: No date: Anemia No date: Depression No date: Diabetes mellitus without complication (HCC) No date: GERD (gastroesophageal reflux disease) No date: Head ache No date: Osteoarthritis No date: Stroke [facility])     Comment: TIA No date: TIA (transient ischemic attack)   Reproductive/Obstetrics                             Anesthesia Physical Anesthesia Plan  ASA: III  Anesthesia Plan: General   Post-op Pain Management:    Induction: Intravenous  Airway Management Planned: Natural Airway  Additional Equipment:   Intra-op Plan:   Post-operative Plan:   Informed Consent: I have reviewed the patients History and Physical, chart, labs and discussed the procedure including the risks, benefits and alternatives for the proposed anesthesia with the patient or authorized representative who has indicated his/her understanding and  acceptance.   Dental advisory given  Plan Discussed with: CRNA and Anesthesiologist  Anesthesia Plan Comments:         Anesthesia Quick Evaluation

## 2016-06-29 NOTE — Anesthesia Postprocedure Evaluation (Signed)
Anesthesia Post Note  Patient: Vicki Norris  Procedure(s) Performed: Procedure(s) (LRB): ESOPHAGOGASTRODUODENOSCOPY (EGD) WITH PROPOFOL (N/A)  Patient location during evaluation: Endoscopy Anesthesia Type: General Level of consciousness: awake and alert and oriented Pain management: pain level controlled Vital Signs Assessment: post-procedure vital signs reviewed and stable Respiratory status: spontaneous breathing, nonlabored ventilation and respiratory function stable Cardiovascular status: blood pressure returned to baseline and stable Postop Assessment: no signs of nausea or vomiting Anesthetic complications: no    Last Vitals:  Vitals:   06/29/16 1027 06/29/16 1207  BP: (!) 135/58 103/60  Pulse: 71 63  Resp: 14 16  Temp: 36.7 C 36.1 C    Last Pain:  Vitals:   06/29/16 1207  TempSrc: Tympanic                 [name]

## 2016-06-29 NOTE — Transfer of Care (Signed)
Immediate Anesthesia Transfer of Care Note  Patient: Vicki Norris  Procedure(s) Performed: Procedure(s): ESOPHAGOGASTRODUODENOSCOPY (EGD) WITH PROPOFOL (N/A)  Patient Location: PACU and Endoscopy Unit  Anesthesia Type:General  Level of Consciousness: sedated  Airway & Oxygen Therapy: Patient Spontanous Breathing and Patient connected to nasal cannula oxygen  Post-op Assessment: Report given to RN and Post -op Vital signs reviewed and stable  Post vital signs: Reviewed and stable  Last Vitals:  Vitals:   06/29/16 1027  BP: (!) 135/58  Pulse: 71  Resp: 14  Temp: 36.7 C    Last Pain:  Vitals:   06/29/16 1027  TempSrc: Tympanic         Complications: No apparent anesthesia complications

## 2016-07-03 LAB — SURGICAL PATHOLOGY

## 2016-10-11 ENCOUNTER — Ambulatory Visit
Admission: EM | Admit: 2016-10-11 | Discharge: 2016-10-11 | Disposition: A | Discharge status: Home / Self Care | Payer: Medicare PPO | Attending: Family Medicine | Admitting: Family Medicine

## 2016-10-11 DIAGNOSIS — J0100 Acute maxillary sinusitis, unspecified: Secondary | ICD-10-CM | POA: Diagnosis not present

## 2016-10-11 MED ORDER — AMOXICILLIN 875 MG PO TABS: 875.0000 mg | ORAL_TABLET | Freq: Two times a day (BID) | ORAL | 0 refills | Status: DC

## 2016-10-11 NOTE — ED Triage Notes (Signed)
Patient complains of left sided sinus pain, pressure in sinuses, left sided facial pain, sweats, left sided throat pain and neck pain x 3 months. Patient states that she tried to drink orange juice last night and wouldn't go down do to throat. Patient states that symptoms worse yesterday. Patient states that she has been nausea today also with some runny nose.

## 2016-10-11 NOTE — ED Provider Notes (Signed)
[facility]    CSN: [id number] Arrival date & time: 10/11/16  [id number]     History   Chief Complaint Chief Complaint  Patient presents with  . Sinusitis    HPI Vicki Norris is a 76 y.o. female.   The history is provided by the patient.  URI  Presenting symptoms: congestion, facial pain, fatigue, rhinorrhea and sore throat   Severity:  Moderate Onset quality:  Sudden Duration:  3 months Timing:  Constant Progression:  Worsening Chronicity:  New Relieved by:  Nothing Worsened by:  Nothing Ineffective treatments:  OTC medications Associated symptoms: headaches and sinus pain   Associated symptoms: no wheezing   Risk factors: being elderly and diabetes mellitus   Risk factors: no chronic cardiac disease, no chronic kidney disease, no chronic respiratory disease, no immunosuppression, no recent illness, no recent travel and no sick contacts     Past Medical History:  Diagnosis Date  . Anemia   . Depression   . Diabetes mellitus without complication ([place])   . GERD (gastroesophageal reflux disease)   . Head ache   . Osteoarthritis   . Stroke [facility])    TIA  . TIA (transient ischemic attack)     There are no active problems to display for this patient.   Past Surgical History:  Procedure Laterality Date  . ABDOMINAL HYSTERECTOMY    . CATARACT EXTRACTION Bilateral   . COLONOSCOPY    . ESOPHAGOGASTRODUODENOSCOPY    . ESOPHAGOGASTRODUODENOSCOPY (EGD) WITH PROPOFOL N/A 06/29/2016   Procedure: ESOPHAGOGASTRODUODENOSCOPY (EGD) WITH PROPOFOL;  Surgeon: Lollie Sails, MD;  [facility]COPY;  Service: Endoscopy;  Laterality: N/A;  . ROTATOR CUFF REPAIR    . TUBAL LIGATION      OB History    No data available       Home Medications    Prior to Admission medications   Medication Sig Start Date End Date Taking? Authorizing Provider  ascorbic acid (VITAMIN C) 250 MG CHEW Chew 250 mg by mouth daily.   Yes Historical Provider, MD  aspirin 81  MG tablet Take 81 mg by mouth daily.   Yes Historical Provider, MD  Carboxymethylcellul-Glycerin (OPTIVE) 0.5-0.9 % SOLN Apply to eye.   Yes Historical Provider, MD  cholecalciferol (VITAMIN D) 1000 units tablet Take 1,000 Units by mouth 2 (two) times daily.    Yes Historical Provider, MD  cyanocobalamin 1000 MCG tablet Take 1,000 mcg by mouth daily.   Yes Historical Provider, MD  glipiZIDE (GLUCOTROL XL) 2.5 MG 24 hr tablet Take 2.5 mg by mouth daily with breakfast.   Yes Historical Provider, MD  Multiple Vitamins-Minerals (MULTIVITAMIN WITH MINERALS) tablet Take 1 tablet by mouth daily.   Yes Historical Provider, MD  Omega-3 Fatty Acids (FISH OIL PO) Take 300 mg by mouth.   Yes Historical Provider, MD  RABEprazole (ACIPHEX) 20 MG tablet Take 20 mg by mouth daily.   Yes Historical Provider, MD  amoxicillin (AMOXIL) 875 MG tablet Take 1 tablet (875 mg total) by mouth 2 (two) times daily. 10/11/16   Norval Gable, MD  Esomeprazole Magnesium (NEXIUM PO) Take 22.3 mg by mouth.    Historical Provider, MD  GARLIC PO Take by mouth.    Historical Provider, MD  HYDROcodone-acetaminophen (NORCO/VICODIN) 5-325 MG tablet Half to one tablet bid as needed 01/10/16   Norval Gable, MD  predniSONE (DELTASONE) 20 MG tablet Take 2 tablets (40 mg total) by mouth daily with breakfast. Patient not taking: Reported on 06/29/2016 09/12/15  Olen Cordial, NP  sodium chloride (OCEAN) 0.65 % SOLN nasal spray Place 2 sprays into both nostrils every 2 (two) hours while awake. 09/12/15 09/19/15  Olen Cordial, NP    Family History History reviewed. No pertinent family history.  Social History Social History  Substance Use Topics  . Smoking status: Never Smoker  . Smokeless tobacco: Never Used  . Alcohol use No     Comment: occassion     Allergies   Metformin and related and Pantoprazole   Review of Systems Review of Systems  Constitutional: Positive for fatigue.  HENT: Positive for congestion,  rhinorrhea, sinus pain and sore throat.   Respiratory: Negative for wheezing.   Neurological: Positive for headaches.     Physical Exam Triage Vital Signs ED Triage Vitals  Enc Vitals Group     BP 10/11/16 0911 128/67     Pulse Rate 10/11/16 0911 81     Resp 10/11/16 0911 16     Temp 10/11/16 0911 98.3 F (36.8 C)     Temp Source 10/11/16 0911 Oral     SpO2 10/11/16 0911 97 %     Weight 10/11/16 0907 173 lb (78.5 kg)     Height 10/11/16 0907 5\' 9"  (1.753 m)     Head Circumference --      Peak Flow --      Pain Score 10/11/16 0910 4     Pain Loc --      Pain Edu? --      Excl. in [place]? --    No data found.   Updated Vital Signs BP 128/67 (BP Location: Left Arm)   Pulse 81   Temp 98.3 F (36.8 C) (Oral)   Resp 16   Ht 5\' 9"  (1.753 m)   Wt 173 lb (78.5 kg)   SpO2 97%   BMI 25.55 kg/m   Visual Acuity Right Eye Distance:   Left Eye Distance:   Bilateral Distance:    Right Eye Near:   Left Eye Near:    Bilateral Near:     Physical Exam  Constitutional: She appears well-developed and well-nourished. No distress.  HENT:  Head: Normocephalic and atraumatic.  Right Ear: Tympanic membrane, external ear and ear canal normal.  Left Ear: Tympanic membrane, external ear and ear canal normal.  Nose: Mucosal edema and rhinorrhea present. No nose lacerations, sinus tenderness, nasal deformity, septal deviation or nasal septal hematoma. No epistaxis.  No foreign bodies. Right sinus exhibits maxillary sinus tenderness and frontal sinus tenderness. Left sinus exhibits maxillary sinus tenderness and frontal sinus tenderness.  Mouth/Throat: Uvula is midline, oropharynx is clear and moist and mucous membranes are normal. No oropharyngeal exudate.  Eyes: Conjunctivae and EOM are normal. Pupils are equal, round, and reactive to light. Right eye exhibits no discharge. Left eye exhibits no discharge. No scleral icterus.  Neck: Normal range of motion. Neck supple. No thyromegaly present.    Cardiovascular: Normal rate, regular rhythm and normal heart sounds.   Pulmonary/Chest: Effort normal and breath sounds normal. No respiratory distress. She has no wheezes. She has no rales.  Lymphadenopathy:    She has no cervical adenopathy.  Skin: She is not diaphoretic.  Nursing note and vitals reviewed.    UC Treatments / Results  Labs (all labs ordered are listed, but only abnormal results are displayed) Labs Reviewed - No data to display  EKG  EKG Interpretation None       Radiology No results found.  Procedures Procedures (  including critical care time)  Medications Ordered in UC Medications - No data to display   Initial Impression / Assessment and Plan / UC Course  I have reviewed the triage vital signs and the nursing notes.  Pertinent labs & imaging results that were available during my care of the patient were reviewed by me and considered in my medical decision making (see chart for details).       Final Clinical Impressions(s) / UC Diagnoses   Final diagnoses:  Acute maxillary sinusitis, recurrence not specified    New Prescriptions Discharge Medication List as of 10/11/2016  9:27 AM    START taking these medications   Details  amoxicillin (AMOXIL) 875 MG tablet Take 1 tablet (875 mg total) by mouth 2 (two) times daily., Starting Wed 10/11/2016, Normal       1. diagnosis reviewed with patient 2. rx as per orders above; reviewed possible side effects, interactions, risks and benefits  3. Recommend supportive treatment with otc analgesics, increased fluids 4. Follow-up prn if symptoms worsen or don't improve   Norval Gable, MD 10/11/16 [phone]

## 2016-10-26 DIAGNOSIS — M1711 Unilateral primary osteoarthritis, right knee: Secondary | ICD-10-CM | POA: Insufficient documentation

## 2016-12-28 ENCOUNTER — Other Ambulatory Visit: Payer: Self-pay | Admitting: Unknown Physician Specialty

## 2016-12-28 DIAGNOSIS — M1711 Unilateral primary osteoarthritis, right knee: Secondary | ICD-10-CM

## 2017-01-01 ENCOUNTER — Other Ambulatory Visit: Payer: Self-pay | Admitting: Unknown Physician Specialty

## 2017-01-01 DIAGNOSIS — M1711 Unilateral primary osteoarthritis, right knee: Secondary | ICD-10-CM

## 2017-01-11 ENCOUNTER — Ambulatory Visit: Payer: Medicare PPO

## 2017-01-22 ENCOUNTER — Ambulatory Visit: Payer: Medicare PPO | Admitting: Dietician

## 2017-01-25 ENCOUNTER — Ambulatory Visit: Payer: Medicare PPO

## 2017-01-29 ENCOUNTER — Encounter: Payer: Medicare PPO | Attending: Family Medicine | Admitting: Dietician

## 2017-01-29 ENCOUNTER — Encounter: Payer: Self-pay | Admitting: Dietician

## 2017-01-29 VITALS — Ht 69.0 in | Wt 180.3 lb

## 2017-01-29 DIAGNOSIS — E119 Type 2 diabetes mellitus without complications: Secondary | ICD-10-CM | POA: Diagnosis not present

## 2017-01-29 DIAGNOSIS — K5790 Diverticulosis of intestine, part unspecified, without perforation or abscess without bleeding: Secondary | ICD-10-CM | POA: Insufficient documentation

## 2017-01-29 DIAGNOSIS — Z713 Dietary counseling and surveillance: Secondary | ICD-10-CM | POA: Insufficient documentation

## 2017-01-29 DIAGNOSIS — K219 Gastro-esophageal reflux disease without esophagitis: Secondary | ICD-10-CM | POA: Diagnosis not present

## 2017-01-29 DIAGNOSIS — E785 Hyperlipidemia, unspecified: Secondary | ICD-10-CM | POA: Diagnosis not present

## 2017-01-29 NOTE — Patient Instructions (Signed)
   Make sure to eat regularly, every 4-5 hours.  Include a protein food, a small-medium portion of a starchy food, and a vegetable and/or fruit with every meal.  Keep talking to your doctor about working on help with sleeping and managing stress.

## 2017-01-29 NOTE — Progress Notes (Signed)
Medical Nutrition Therapy: Visit start time: 1430  end time: 1545  Assessment:  Diagnosis: diabetes Past medical history: HLD, GERD, diverticulosis Psychosocial issues/ stress concerns: patient reports high stress level -- caring for husband with effects from CVA, son lives with them, caring for dogs who interrupt her sleep at night.  Preferred learning method:  . Auditory   Current weight: 180.3lbs with shoes  Height: 5'9" Medications, supplements: reconciled list in medical record  Progress and evaluation: Patient reports 15-20lb weight loss since diagnosis of diabetes, she made some diet changes, but requests help with knowing what she can eat. She has had advice from multiple people telling her different things. She reports shakiness in hands frequently, and sometimes has difficulty holding objects such as a cup or glass. She also reports difficulty sleeping and restless legs at night; describes feeling of something crawling all over her body.    Physical activity: ADLs, walking in yards with dogs  Dietary Intake:  Usual eating pattern includes 3 meals and 1-2 snacks per day. Dining out frequency: 1 meals per week.  Breakfast: plain cheerios with milk (hungry soon after); occasionally a smoked sausage biscuit Snack: bologna on wheat bread Lunch: tomato and lettuce sandwich or BLT Snack: none Supper: sandwich Snack: skinny pop popcorn sometimes if blood sugar is below 100. If in 90s or less at bedtime, drinks orange juice Beverages: water  Nutrition Care Education: Topics covered: diabetes Basic nutrition: basic food groups, appropriate nutrient balance, appropriate meal and snack schedule, general nutrition guidelines    Diabetes:  appropriate meal and snack schedule, appropriate carb intake and balance, healthy carb choices, role of stress and sleep in controlling blood sugars. Other lifestyle changes:  Importance of stress management and adequate sleep.  Nutritional Diagnosis:   [place]-2.1 Inpaired nutrition utilization As related to diabetes.  As evidenced by MD notes, patient report.  Intervention: Instruction as noted above.   Advised her to continue working with MD on sleep and stress issues, as well as trembling symptoms.   Set goals with input from patient.   She declined follow-up at this time, but will schedule later if needed.  Education Materials given:  . General diet guidelines for Diabetes . Plate Planner with food lists . Sample menus . Goals/ instructions  Learner/ who was taught:  . Patient   Level of understanding: [name] Verbalizes/ demonstrates competency  Demonstrated degree of understanding via:   Teach back Learning barriers: . None  Willingness to learn/ readiness for change: . Eager, change in progress  Monitoring and Evaluation:  Dietary intake, exercise, BG control, and body weight      follow up: prn

## 2017-04-02 ENCOUNTER — Ambulatory Visit (INDEPENDENT_AMBULATORY_CARE_PROVIDER_SITE_OTHER): Payer: Medicare PPO

## 2017-04-02 ENCOUNTER — Ambulatory Visit
Admission: EM | Admit: 2017-04-02 | Discharge: 2017-04-02 | Disposition: A | Discharge status: Home / Self Care | Payer: Medicare PPO | Attending: Family Medicine | Admitting: Family Medicine

## 2017-04-02 ENCOUNTER — Encounter: Payer: Self-pay | Admitting: Emergency Medicine

## 2017-04-02 DIAGNOSIS — R52 Pain, unspecified: Secondary | ICD-10-CM | POA: Diagnosis not present

## 2017-04-02 DIAGNOSIS — M1990 Unspecified osteoarthritis, unspecified site: Secondary | ICD-10-CM | POA: Diagnosis not present

## 2017-04-02 DIAGNOSIS — M13851 Other specified arthritis, right hip: Secondary | ICD-10-CM | POA: Diagnosis not present

## 2017-04-02 DIAGNOSIS — M79604 Pain in right leg: Secondary | ICD-10-CM

## 2017-04-02 MED ORDER — HYDROCODONE-ACETAMINOPHEN 5-325 MG PO TABS: ORAL_TABLET | ORAL | 0 refills | Status: DC

## 2017-04-02 NOTE — ED Triage Notes (Signed)
Patient c/o right leg pain since November 2017.  Patient states that she has been seen for her leg pain but states that she can not afford a MRI.  Patient reports pain goes down into her right hip.

## 2017-04-02 NOTE — ED Provider Notes (Signed)
[facility]    CSN: [id number] Arrival date & time: 04/02/17  0825     History   Chief Complaint Chief Complaint  Patient presents with  . Leg Pain    right    HPI Ellsie Faithanne Norris is a 76 y.o. female.   The history is provided by the patient.  Hip Pain  This is a new (right hip pain radiating down leg) problem. The current episode started more than 1 week ago. The problem occurs constantly. The problem has not changed since onset.Pertinent negatives include no chest pain, no abdominal pain, no headaches and no shortness of breath. The symptoms are aggravated by walking. Nothing relieves the symptoms.    Past Medical History:  Diagnosis Date  . Anemia   . Depression   . Diabetes mellitus without complication ([place])   . GERD (gastroesophageal reflux disease)   . Head ache   . Osteoarthritis   . Stroke [facility])    TIA  . TIA (transient ischemic attack)     Patient Active Problem List   Diagnosis Date Noted  . Primary osteoarthritis of right knee 10/26/2016  . Other constipation 06/19/2016  . Hypercholesterolemia 06/08/2016  . New onset type 2 diabetes mellitus ([place]) 06/08/2016  . Vitamin B 12 deficiency 06/08/2016  . Recurrent major depressive disorder ([place]) 04/14/2016  . Headache 09/20/2015  . Depression 01/07/2014  . GERD (gastroesophageal reflux disease) 01/07/2014    Past Surgical History:  Procedure Laterality Date  . ABDOMINAL HYSTERECTOMY    . CATARACT EXTRACTION Bilateral   . COLONOSCOPY    . ESOPHAGOGASTRODUODENOSCOPY    . ESOPHAGOGASTRODUODENOSCOPY (EGD) WITH PROPOFOL N/A 06/29/2016   Procedure: ESOPHAGOGASTRODUODENOSCOPY (EGD) WITH PROPOFOL;  Surgeon: Lollie Sails, MD;  Location: [facility] ENDOSCOPY;  Service: Endoscopy;  Laterality: N/A;  . ROTATOR CUFF REPAIR    . TUBAL LIGATION      OB History    No data available       Home Medications    Prior to Admission medications   Medication Sig Start Date End Date Taking?  Authorizing Provider  amoxicillin (AMOXIL) 875 MG tablet Take 1 tablet (875 mg total) by mouth 2 (two) times daily. Patient not taking: Reported on 01/29/2017 10/11/16   Norval Gable, MD  ascorbic acid (VITAMIN C) 250 MG CHEW Chew 250 mg by mouth daily.    [provider]  aspirin 81 MG tablet Take 81 mg by mouth daily.    [provider]  Carboxymethylcellul-Glycerin (OPTIVE) 0.5-0.9 % SOLN Apply to eye.    [provider]  cholecalciferol (VITAMIN D) 1000 units tablet Take 1,000 Units by mouth 2 (two) times daily.     [provider]  cyanocobalamin 1000 MCG tablet Take 1,000 mcg by mouth daily.    [provider]  Esomeprazole Magnesium (NEXIUM PO) Take 22.3 mg by mouth.    [provider]  GARLIC PO Take by mouth.    [provider]  glipiZIDE (GLUCOTROL XL) 2.5 MG 24 hr tablet Take 2.5 mg by mouth daily with breakfast.    [provider]  HYDROcodone-acetaminophen (NORCO/VICODIN) 5-325 MG tablet Half a tab po bid prn 04/02/17   Norval Gable, MD  Multiple Vitamins-Minerals (MULTIVITAMIN WITH MINERALS) tablet Take 1 tablet by mouth daily.    [provider]  Omega-3 Fatty Acids (FISH OIL PO) Take 300 mg by mouth.    [provider]  predniSONE (DELTASONE) 20 MG tablet Take 2 tablets (40 mg total) by mouth  daily with breakfast. Patient not taking: Reported on 06/29/2016 09/12/15   Gerarda Fraction A, NP  RABEprazole (ACIPHEX) 20 MG tablet Take 20 mg by mouth daily.    [provider]  sodium chloride (OCEAN) 0.65 % SOLN nasal spray Place 2 sprays into both nostrils every 2 (two) hours while awake. 09/12/15 09/19/15  BetancourtAura Fey, NP    Family History History reviewed. No pertinent family history.  Social History Social History  Substance Use Topics  . Smoking status: Never Smoker  . Smokeless tobacco: Never Used  . Alcohol use No     Comment: occassion     Allergies   Citalopram;  Simvastatin; Metformin and related; and Pantoprazole   Review of Systems Review of Systems  Respiratory: Negative for shortness of breath.   Cardiovascular: Negative for chest pain.  Gastrointestinal: Negative for abdominal pain.  Neurological: Negative for headaches.     Physical Exam Triage Vital Signs ED Triage Vitals  Enc Vitals Group     BP 04/02/17 0915 (!) 150/72     Pulse Rate 04/02/17 0915 67     Resp 04/02/17 0915 14     Temp 04/02/17 0915 98.4 F (36.9 C)     Temp Source 04/02/17 0915 Oral     SpO2 04/02/17 0915 99 %     Weight 04/02/17 0913 174 lb (78.9 kg)     Height 04/02/17 0913 5\' 9"  (1.753 m)     Head Circumference --      Peak Flow --      Pain Score 04/02/17 0913 8     Pain Loc --      Pain Edu? --      Excl. in [place]? --    No data found.   Updated Vital Signs BP (!) 150/72 (BP Location: Left Arm)   Pulse 67   Temp 98.4 F (36.9 C) (Oral)   Resp 14   Ht 5\' 9"  (1.753 m)   Wt 174 lb (78.9 kg)   SpO2 99%   BMI 25.70 kg/m   Visual Acuity Right Eye Distance:   Left Eye Distance:   Bilateral Distance:    Right Eye Near:   Left Eye Near:    Bilateral Near:     Physical Exam  Musculoskeletal:       Right hip: She exhibits tenderness and bony tenderness. She exhibits normal range of motion, normal strength, no swelling, no crepitus, no deformity and no laceration.  Nursing note and vitals reviewed.    UC Treatments / Results  Labs (all labs ordered are listed, but only abnormal results are displayed) Labs Reviewed - No data to display  EKG  EKG Interpretation None       Radiology Dg Hip Unilat With Pelvis 2-3 Views Right  Result Date: 04/02/2017 CLINICAL DATA:  RIGHT-sided hip pain EXAM: DG HIP (WITH OR WITHOUT PELVIS) 2-3V RIGHT COMPARISON:  None. FINDINGS: Hips are located. No evidence of pelvic fracture or sacral fracture. Dedicated view of the RIGHT hip demonstrates no femoral neck fracture. Minimal joint space narrowing in the  hips. Mild sclerosis of acetabular roofs. IMPRESSION: 1. Mild osteoarthritis of the hips. 2. No acute findings. Electronically Signed   By: Suzy Bouchard M.D.   On: 04/02/2017 10:08    Procedures Procedures (including critical care time)  Medications Ordered in UC Medications - No data to display   Initial Impression / Assessment and Plan / UC Course  I have reviewed the triage vital signs and  the nursing notes.  Pertinent labs & imaging results that were available during my care of the patient were reviewed by me and considered in my medical decision making (see chart for details).       Final Clinical Impressions(s) / UC Diagnoses   Final diagnoses:  Pain  Right leg pain  Arthritis    New Prescriptions Discharge Medication List as of 04/02/2017 10:37 AM     1. X-ray results and  diagnosis reviewed with patient 2. rx as per orders above; reviewed possible side effects, interactions, risks and benefits  3. Recommend supportive treatment with otc analgesics prn 4. Follow-up prn if symptoms worsen or don't improve  Controlled Substance Prescriptions [place] Controlled Substance Registry consulted? No   Norval Gable, MD 04/02/17 [phone]

## 2017-06-07 ENCOUNTER — Other Ambulatory Visit: Payer: Self-pay | Admitting: Family Medicine

## 2017-06-07 DIAGNOSIS — Z780 Asymptomatic menopausal state: Secondary | ICD-10-CM

## 2017-06-15 ENCOUNTER — Other Ambulatory Visit: Payer: Self-pay | Admitting: Family Medicine

## 2017-06-15 DIAGNOSIS — Z1231 Encounter for screening mammogram for malignant neoplasm of breast: Secondary | ICD-10-CM

## 2017-07-10 ENCOUNTER — Ambulatory Visit
Admission: RE | Admit: 2017-07-10 | Discharge: 2017-07-10 | Disposition: A | Discharge status: Home / Self Care | Payer: Medicare PPO | Source: Ambulatory Visit | Attending: Family Medicine | Admitting: Family Medicine

## 2017-07-10 DIAGNOSIS — Z780 Asymptomatic menopausal state: Secondary | ICD-10-CM | POA: Diagnosis present

## 2017-07-10 DIAGNOSIS — Z1231 Encounter for screening mammogram for malignant neoplasm of breast: Secondary | ICD-10-CM | POA: Insufficient documentation

## 2017-07-10 DIAGNOSIS — M8588 Other specified disorders of bone density and structure, other site: Secondary | ICD-10-CM | POA: Insufficient documentation

## 2017-08-31 ENCOUNTER — Other Ambulatory Visit
Admission: RE | Admit: 2017-08-31 | Discharge: 2017-08-31 | Disposition: A | Discharge status: Home / Self Care | Payer: Medicare PPO | Source: Ambulatory Visit | Attending: Ophthalmology | Admitting: Ophthalmology

## 2017-08-31 DIAGNOSIS — M316 Other giant cell arteritis: Secondary | ICD-10-CM | POA: Insufficient documentation

## 2017-08-31 LAB — CBC WITH DIFFERENTIAL/PLATELET
BASOS PCT: 1 %
Basophils Absolute: 0 10*3/uL (ref 0–0.1)
Eosinophils Absolute: 0.1 10*3/uL (ref 0–0.7)
Eosinophils Relative: 1 %
HEMATOCRIT: 37.1 % (ref 35.0–47.0)
Hemoglobin: 12.6 g/dL (ref 12.0–16.0)
LYMPHS ABS: 1.7 10*3/uL (ref 1.0–3.6)
LYMPHS PCT: 31 %
MCH: 30.9 pg (ref 26.0–34.0)
MCHC: 34 g/dL (ref 32.0–36.0)
MCV: 91 fL (ref 80.0–100.0)
MONO ABS: 0.4 10*3/uL (ref 0.2–0.9)
MONOS PCT: 8 %
NEUTROS ABS: 3.2 10*3/uL (ref 1.4–6.5)
Neutrophils Relative %: 59 %
Platelets: 270 10*3/uL (ref 150–440)
RBC: 4.07 MIL/uL (ref 3.80–5.20)
RDW: 13.7 % (ref 11.5–14.5)
WBC: 5.3 10*3/uL (ref 3.6–11.0)

## 2017-08-31 LAB — SEDIMENTATION RATE: Sed Rate: 27 mm/hr (ref 0–30)

## 2017-08-31 LAB — C-REACTIVE PROTEIN: CRP: <0.8 mg/dL (ref <1.0)

## 2017-09-11 DIAGNOSIS — M8580 Other specified disorders of bone density and structure, unspecified site: Secondary | ICD-10-CM | POA: Insufficient documentation

## 2017-09-11 DIAGNOSIS — K2289 Other specified disease of esophagus: Secondary | ICD-10-CM | POA: Insufficient documentation

## 2017-10-03 ENCOUNTER — Ambulatory Visit
Admission: EM | Admit: 2017-10-03 | Discharge: 2017-10-03 | Disposition: A | Discharge status: Home / Self Care | Payer: Medicare PPO | Attending: Emergency Medicine | Admitting: Emergency Medicine

## 2017-10-03 ENCOUNTER — Other Ambulatory Visit: Payer: Self-pay

## 2017-10-03 DIAGNOSIS — R0982 Postnasal drip: Secondary | ICD-10-CM

## 2017-10-03 DIAGNOSIS — J029 Acute pharyngitis, unspecified: Secondary | ICD-10-CM | POA: Diagnosis not present

## 2017-10-03 LAB — RAPID STREP SCREEN (MED CTR MEBANE ONLY): Streptococcus, Group A Screen (Direct): NEGATIVE

## 2017-10-03 MED ORDER — IPRATROPIUM BROMIDE 0.06 % NA SOLN: 2.0000 | Freq: Four times a day (QID) | NASAL | 0 refills | Status: DC

## 2017-10-03 NOTE — ED Triage Notes (Signed)
Pt reports she started yesterday with nasal congestion, cough, sinus drainage and sore throat.

## 2017-10-03 NOTE — Discharge Instructions (Signed)
Try some saline nasal irrigation with a Neti pot or neil med sinus rinse to reduce the amount of postnasal drip that you are having.  Atrovent will help with this as well. your rapid strep was negative today, so we have sent off a throat culture.  We will contact you and call in the appropriate antibiotics if your culture comes back positive for an infection requiring antibiotic treatment.  Give [place] a working phone number. 500 mg of Tylenol and 200-400  mg ibuprofen together 3-4 times a day as needed for pain.  Make sure you drink plenty of extra fluids.  Some people find salt water gargles and  Tr[employer] Medic[employer]'s "Throat Coat" tea helpful. Take 5 mL of liquid Benadryl and 5 mL of Maalox. Mix it together, and then hold it in your mouth for as long as you can and then swallow. You may do this 4 times a day.    Go to www.goodrx.com to look up your medications. This will give you a list of where you can find your prescriptions at the most affordable prices. Or ask the pharmacist what the cash price is, or if they have any other discount programs available to help make your medication more affordable. This can be less expensive than what you would pay with insurance.

## 2017-10-03 NOTE — ED Provider Notes (Addendum)
HPI  SUBJECTIVE:  Vicki Norris is a 77 y.o. female who presents with right-sided neck pain and a "tender gland" with pain radiating to her ear.  Sx started 3 days ago. She denies lymphadenopathy.  She reports burning sore throat, nasal congestion, rhinorrhea, extensive postnasal drip.  She reports bilateral ear pain and sinus headaches.  She states that her voice is raspy.  She tried some leftover amoxicillin last night without improvement in her symptoms.  Symptoms worse with swallowing.  No fevers, body aches, cough, sensation of throat swelling shut, difficulty breathing, drooling, trismus, rash, abdominal pain, allergy symptoms, GERD symptoms.  States that she is compliant with her GERD medications.  She also reports diffuse thoracic back pain described as throbbing, stabbing, intermittent, lasting hours starting this morning.  States that she had to sleep sitting up last night due to the postnasal drip.  Her back pain is worse with torso rotation and bending forward.  No alleviating factors.  Not tried anything for this specifically.  No contacts with strep.  No other antibiotics in the past month.  No antipyretic in the past 6-8 hours.  She has a past medical history of GERD, diabetes, TIA.  No history of hypertension, kidney disease.  PMD: Sharyne Peach, MD     Past Medical History:  Diagnosis Date  . Anemia   . Depression   . Diabetes mellitus without complication ([place])   . GERD (gastroesophageal reflux disease)   . Head ache   . Osteoarthritis   . Stroke [facility])    TIA  . TIA (transient ischemic attack)     Past Surgical History:  Procedure Laterality Date  . ABDOMINAL HYSTERECTOMY    . CATARACT EXTRACTION Bilateral   . COLONOSCOPY    . ESOPHAGOGASTRODUODENOSCOPY    . ESOPHAGOGASTRODUODENOSCOPY (EGD) WITH PROPOFOL N/A 06/29/2016   Procedure: ESOPHAGOGASTRODUODENOSCOPY (EGD) WITH PROPOFOL;  Surgeon: Lollie Sails, MD;  Location: [facility] ENDOSCOPY;  Service: Endoscopy;   Laterality: N/A;  . ROTATOR CUFF REPAIR    . TUBAL LIGATION      Family History  Problem Relation Age of Onset  . Breast cancer Sister 84    Social History   Tobacco Use  . Smoking status: Never Smoker  . Smokeless tobacco: Never Used  Substance Use Topics  . Alcohol use: No    Alcohol/week: 0.6 oz    Types: 1 Cans of beer per week    Comment: occassion  . Drug use: No    No current facility-administered medications for this encounter.   Current Outpatient Medications:  .  rosuvastatin (CRESTOR) 5 MG tablet, Take 5 mg by mouth daily., Disp: , Rfl:  .  ascorbic acid (VITAMIN C) 250 MG CHEW, Chew 250 mg by mouth daily., Disp: , Rfl:  .  aspirin 81 MG tablet, Take 81 mg by mouth daily., Disp: , Rfl:  .  Carboxymethylcellul-Glycerin (OPTIVE) 0.5-0.9 % SOLN, Apply to eye., Disp: , Rfl:  .  cholecalciferol (VITAMIN D) 1000 units tablet, Take 1,000 Units by mouth 2 (two) times daily. , Disp: , Rfl:  .  cyanocobalamin 1000 MCG tablet, Take 1,000 mcg by mouth daily., Disp: , Rfl:  .  Esomeprazole Magnesium (NEXIUM PO), Take 22.3 mg by mouth., Disp: , Rfl:  .  GARLIC PO, Take by mouth., Disp: , Rfl:  .  glipiZIDE (GLUCOTROL XL) 2.5 MG 24 hr tablet, Take 2.5 mg by mouth daily with breakfast., Disp: , Rfl:  .  ipratropium (ATROVENT) 0.06 % nasal  spray, Place 2 sprays into both nostrils 4 (four) times daily. 3-4 times/ day, Disp: 15 mL, Rfl: 0 .  Multiple Vitamins-Minerals (MULTIVITAMIN WITH MINERALS) tablet, Take 1 tablet by mouth daily., Disp: , Rfl:  .  Omega-3 Fatty Acids (FISH OIL PO), Take 300 mg by mouth., Disp: , Rfl:  .  RABEprazole (ACIPHEX) 20 MG tablet, Take 20 mg by mouth daily., Disp: , Rfl:  .  sodium chloride (OCEAN) 0.65 % SOLN nasal spray, Place 2 sprays into both nostrils every 2 (two) hours while awake., Disp: , Rfl: 0  Allergies  Allergen Reactions  . Citalopram Other (See Comments)    tremors  . Simvastatin Other (See Comments)    myalgias  . Metformin And  Related     Constipation,diarrhea  . Pantoprazole Swelling     ROS  As noted in HPI.   Physical Exam  BP 140/63 (BP Location: Left Arm)   Pulse 73   Temp 98.2 F (36.8 C) (Oral)   Resp 18   Ht 5\' 10"  (1.778 m)   Wt 177 lb (80.3 kg)   SpO2 98%   BMI 25.40 kg/m   Constitutional: Well developed, well nourished, no acute distress Eyes: PERRL, EOMI, conjunctiva normal bilaterally HENT: Normocephalic, atraumatic,mucus membranes moist.  TMs normal bilaterally.  Positive clear rhinorrhea, normal turbinates.  Mild maxillary sinus tenderness.  No frontal sinus tenderness.  Slightly erythematous oropharynx, tonsils normal.  Uvula midline.  No obvious postnasal drip or cobblestoning.   Neck: No cervical lymphadenopathy Respiratory: Clear to auscultation bilaterally, no rales, no wheezing, no rhonchi.  No bony or muscular tenderness posteriorally.  Cardiovascular: Normal rate and rhythm, no murmurs, no gallops, no rubs GI: Nondistended skin: No rash, skin intact Musculoskeletal: No deformities. Neurologic: Alert & oriented x 3, CN II-XII grossly intact, no motor deficits, sensation grossly intact Psychiatric: Speech and behavior appropriate   ED Course   Medications - No data to display  Orders Placed This Encounter  Procedures  . Rapid strep screen    Standing Status:   Standing    Number of Occurrences:   1  . Culture, group A strep    Standing Status:   Standing    Number of Occurrences:   1   Results for orders placed or performed during the hospital encounter of 10/03/17 (from the past 24 hour(s))  Rapid strep screen     Status: None   Collection Time: 10/03/17  9:23 AM  Result Value Ref Range   Streptococcus, Group A Screen (Direct) NEGATIVE NEGATIVE   No results found.  ED Clinical Impression  Sore throat  Post-nasal drip   ED Assessment/Plan   Will check a rapid strep, but suspect viral pharyngitis or sore throat from the postnasal drip that she is  describing.  Doubt pneumonia.  Will send home with saline nasal irrigation, Atrovent nasal spray, advised Benadryl/Maalox mixture, ibuprofen 200 mg to take with 500 mg of Tylenol 3-4 times a day as needed for sore throat, back pain.  Suspect that the back pain was from her sleeping differently last night.  Follow-up with her primary care physician in several days if she is not getting any better, to the ER if she gets worse.  Rapid strep negative.  Culture sent.  Plan as above.  Discussed labs, imaging, MDM, plan and followup with patient Discussed sn/sx that should prompt return to the ED. patient agrees with plan.   Meds ordered this encounter  Medications  . ipratropium (ATROVENT) 0.06 %  nasal spray    Sig: Place 2 sprays into both nostrils 4 (four) times daily. 3-4 times/ day    Dispense:  15 mL    Refill:  0    *This clinic note was created using [employer]. Therefore, there may be occasional mistakes despite careful proofreading.  ?   Melynda Ripple, MD 10/03/17 0211    Melynda Ripple, MD 10/04/17 [phone]

## 2017-10-04 ENCOUNTER — Telehealth: Payer: Self-pay

## 2017-10-04 MED ORDER — BENZONATATE 200 MG PO CAPS: 200.0000 mg | ORAL_CAPSULE | Freq: Three times a day (TID) | ORAL | 0 refills | Status: DC | PRN

## 2017-10-04 MED ORDER — HYDROCOD POLST-CPM POLST ER 10-8 MG/5ML PO SUER: 2.5000 mL | Freq: Two times a day (BID) | ORAL | 0 refills | Status: DC | PRN

## 2017-10-04 NOTE — Telephone Encounter (Signed)
Patient reports that she is not improving and would like to have a cough medicine on hand.

## 2017-10-04 NOTE — Telephone Encounter (Addendum)
We can call in some Tessalon for her to try first, and 2.5 mL to 5 mL of Tussionex.  She is to be very careful with the Tussionex as it has been narcotic in it which can seriously affect her balance.  I would prefer her to not take it she absolutely has to.  She needs to follow-up with her primary care physician or may return here if not getting any better.  Tessalon and Tussionex went to the wrong pharmacy.  Will resend to [facility].

## 2017-10-05 LAB — CULTURE, GROUP A STREP (THRC)

## 2017-11-01 ENCOUNTER — Other Ambulatory Visit: Payer: Self-pay

## 2017-11-01 ENCOUNTER — Ambulatory Visit: Payer: Medicare PPO | Admitting: Anesthesiology

## 2017-11-01 ENCOUNTER — Encounter: Payer: Self-pay | Admitting: Anesthesiology

## 2017-11-01 ENCOUNTER — Encounter
Admission: RE | Disposition: A | Discharge status: Home / Self Care | Payer: Self-pay | Source: Ambulatory Visit | Attending: Gastroenterology

## 2017-11-01 ENCOUNTER — Ambulatory Visit
Admission: RE | Admit: 2017-11-01 | Discharge: 2017-11-01 | Disposition: A | Discharge status: Home / Self Care | Payer: Medicare PPO | Source: Ambulatory Visit | Attending: Gastroenterology | Admitting: Gastroenterology

## 2017-11-01 DIAGNOSIS — Z79899 Other long term (current) drug therapy: Secondary | ICD-10-CM | POA: Insufficient documentation

## 2017-11-01 DIAGNOSIS — Z7982 Long term (current) use of aspirin: Secondary | ICD-10-CM | POA: Diagnosis not present

## 2017-11-01 DIAGNOSIS — K635 Polyp of colon: Secondary | ICD-10-CM | POA: Insufficient documentation

## 2017-11-01 DIAGNOSIS — Z8673 Personal history of transient ischemic attack (TIA), and cerebral infarction without residual deficits: Secondary | ICD-10-CM | POA: Diagnosis not present

## 2017-11-01 DIAGNOSIS — M1711 Unilateral primary osteoarthritis, right knee: Secondary | ICD-10-CM | POA: Insufficient documentation

## 2017-11-01 DIAGNOSIS — Z888 Allergy status to other drugs, medicaments and biological substances status: Secondary | ICD-10-CM | POA: Insufficient documentation

## 2017-11-01 DIAGNOSIS — K2950 Unspecified chronic gastritis without bleeding: Secondary | ICD-10-CM | POA: Insufficient documentation

## 2017-11-01 DIAGNOSIS — K3189 Other diseases of stomach and duodenum: Secondary | ICD-10-CM | POA: Insufficient documentation

## 2017-11-01 DIAGNOSIS — F329 Major depressive disorder, single episode, unspecified: Secondary | ICD-10-CM | POA: Diagnosis not present

## 2017-11-01 DIAGNOSIS — E119 Type 2 diabetes mellitus without complications: Secondary | ICD-10-CM | POA: Insufficient documentation

## 2017-11-01 DIAGNOSIS — E7800 Pure hypercholesterolemia, unspecified: Secondary | ICD-10-CM | POA: Diagnosis not present

## 2017-11-01 DIAGNOSIS — K5730 Diverticulosis of large intestine without perforation or abscess without bleeding: Secondary | ICD-10-CM | POA: Insufficient documentation

## 2017-11-01 DIAGNOSIS — R1310 Dysphagia, unspecified: Secondary | ICD-10-CM | POA: Diagnosis not present

## 2017-11-01 DIAGNOSIS — K317 Polyp of stomach and duodenum: Secondary | ICD-10-CM | POA: Insufficient documentation

## 2017-11-01 DIAGNOSIS — K210 Gastro-esophageal reflux disease with esophagitis: Secondary | ICD-10-CM | POA: Insufficient documentation

## 2017-11-01 DIAGNOSIS — D122 Benign neoplasm of ascending colon: Secondary | ICD-10-CM | POA: Diagnosis not present

## 2017-11-01 DIAGNOSIS — Z1211 Encounter for screening for malignant neoplasm of colon: Secondary | ICD-10-CM | POA: Diagnosis not present

## 2017-11-01 DIAGNOSIS — Z7984 Long term (current) use of oral hypoglycemic drugs: Secondary | ICD-10-CM | POA: Insufficient documentation

## 2017-11-01 DIAGNOSIS — Z8371 Family history of colonic polyps: Secondary | ICD-10-CM | POA: Insufficient documentation

## 2017-11-01 DIAGNOSIS — Z86010 Personal history of colonic polyps: Secondary | ICD-10-CM | POA: Diagnosis not present

## 2017-11-01 HISTORY — PX: ESOPHAGOGASTRODUODENOSCOPY (EGD) WITH PROPOFOL: SHX5813

## 2017-11-01 HISTORY — PX: COLONOSCOPY WITH PROPOFOL: SHX5780

## 2017-11-01 LAB — GLUCOSE, CAPILLARY: Glucose-Capillary: 102 mg/dL — ABNORMAL HIGH (ref 65–99)

## 2017-11-01 SURGERY — COLONOSCOPY WITH PROPOFOL
Anesthesia: General

## 2017-11-01 MED ORDER — FENTANYL CITRATE (PF) 100 MCG/2ML IJ SOLN
INTRAMUSCULAR | Status: DC | PRN
  Administered 2017-11-01: 25 ug via INTRAVENOUS
  Administered 2017-11-01: 50 ug via INTRAVENOUS
  Administered 2017-11-01: 25 ug via INTRAVENOUS

## 2017-11-01 MED ORDER — LIDOCAINE HCL (PF) 2 % IJ SOLN
INTRAMUSCULAR | Status: AC
  Filled 2017-11-01: qty 10

## 2017-11-01 MED ORDER — PROPOFOL 10 MG/ML IV BOLUS
INTRAVENOUS | Status: AC
  Filled 2017-11-01: qty 20

## 2017-11-01 MED ORDER — BUTAMBEN-TETRACAINE-BENZOCAINE 2-2-14 % EX AERO
INHALATION_SPRAY | CUTANEOUS | Status: AC
  Filled 2017-11-01: qty 5

## 2017-11-01 MED ORDER — PROPOFOL 500 MG/50ML IV EMUL
INTRAVENOUS | Status: DC | PRN
  Administered 2017-11-01: 120 ug/kg/min via INTRAVENOUS

## 2017-11-01 MED ORDER — SODIUM CHLORIDE 0.9 % IV SOLN
INTRAVENOUS | Status: DC
  Administered 2017-11-01: 08:00:00 via INTRAVENOUS

## 2017-11-01 MED ORDER — FENTANYL CITRATE (PF) 100 MCG/2ML IJ SOLN
INTRAMUSCULAR | Status: AC
  Filled 2017-11-01: qty 2

## 2017-11-01 MED ORDER — PROPOFOL 500 MG/50ML IV EMUL
INTRAVENOUS | Status: AC
  Filled 2017-11-01: qty 50

## 2017-11-01 MED ORDER — SODIUM CHLORIDE 0.9 % IV SOLN: INTRAVENOUS | Status: DC

## 2017-11-01 MED ORDER — PHENYLEPHRINE HCL 10 MG/ML IJ SOLN
INTRAMUSCULAR | Status: AC
Start: 2017-11-01 — End: 2017-11-01
  Filled 2017-11-01: qty 1

## 2017-11-01 MED ORDER — LABETALOL HCL 5 MG/ML IV SOLN
INTRAVENOUS | Status: DC | PRN
  Administered 2017-11-01: 2.5 mg via INTRAVENOUS

## 2017-11-01 MED ORDER — PHENYLEPHRINE HCL 10 MG/ML IJ SOLN
INTRAMUSCULAR | Status: DC | PRN
  Administered 2017-11-01: 50 ug via INTRAVENOUS

## 2017-11-01 MED ORDER — LABETALOL HCL 5 MG/ML IV SOLN
INTRAVENOUS | Status: AC
  Filled 2017-11-01: qty 4

## 2017-11-01 MED ORDER — LIDOCAINE HCL (CARDIAC) 20 MG/ML IV SOLN
INTRAVENOUS | Status: DC | PRN
  Administered 2017-11-01: 30 mg via INTRAVENOUS

## 2017-11-01 MED ORDER — EPHEDRINE SULFATE 50 MG/ML IJ SOLN
INTRAMUSCULAR | Status: DC | PRN
  Administered 2017-11-01: 5 mg via INTRAVENOUS
  Administered 2017-11-01 (×3): 10 mg via INTRAVENOUS
  Administered 2017-11-01: 5 mg via INTRAVENOUS

## 2017-11-01 MED ORDER — EPHEDRINE SULFATE 50 MG/ML IJ SOLN
INTRAMUSCULAR | Status: AC
  Filled 2017-11-01: qty 1

## 2017-11-01 NOTE — Anesthesia Preprocedure Evaluation (Signed)
Anesthesia Evaluation  Patient identified by MRN, date of birth, ID band Patient awake    Reviewed: Allergy & Precautions, H&P , NPO status , reviewed documented beta blocker date and time   Airway Mallampati: II       Dental  (+) Upper Dentures, Missing   Pulmonary    Pulmonary exam normal        Cardiovascular Normal cardiovascular exam     Neuro/Psych  Headaches, PSYCHIATRIC DISORDERS Depression TIACVA, No Residual Symptoms    GI/Hepatic GERD  ,  Endo/Other  diabetes  Renal/GU      Musculoskeletal  (+) Arthritis , Osteoarthritis,    Abdominal   Peds  Hematology  (+) anemia ,   Anesthesia Other Findings Depression GERD (gastroesophageal reflux disease) Headache    Hypercholesterolemia  New onset type 2 diabetes mellitus (HCC)  Other constipation  Primary osteoarthritis of right knee  Recurrent major depressive disorder (HCC)    Reproductive/Obstetrics                             Anesthesia Physical Anesthesia Plan  ASA: III  Anesthesia Plan: General   Post-op Pain Management:    Induction:   PONV Risk Score and Plan: Propofol infusion  Airway Management Planned:   Additional Equipment:   Intra-op Plan:   Post-operative Plan:   Informed Consent: I have reviewed the patients History and Physical, chart, labs and discussed the procedure including the risks, benefits and alternatives for the proposed anesthesia with the patient or authorized representative who has indicated his/her understanding and acceptance.   Dental Advisory Given  Plan Discussed with: CRNA  Anesthesia Plan Comments:         Anesthesia Quick Evaluation

## 2017-11-01 NOTE — Anesthesia Postprocedure Evaluation (Signed)
Anesthesia Post Note  Patient: Vicki Norris  Procedure(s) Performed: COLONOSCOPY WITH PROPOFOL (N/A ) ESOPHAGOGASTRODUODENOSCOPY (EGD) WITH PROPOFOL (N/A )  Patient location during evaluation: Endoscopy Anesthesia Type: General Level of consciousness: awake and alert Pain management: pain level controlled Vital Signs Assessment: post-procedure vital signs reviewed and stable Respiratory status: spontaneous breathing, nonlabored ventilation and respiratory function stable Cardiovascular status: blood pressure returned to baseline and stable Postop Assessment: no apparent nausea or vomiting Anesthetic complications: no     Last Vitals:  Vitals Value Taken Time  BP    Temp    Pulse    Resp    SpO2      Last Pain:  Vitals:   11/01/17 0919  TempSrc:   PainSc: 0-No pain                 Alphonsus Sias

## 2017-11-01 NOTE — Anesthesia Post-op Follow-up Note (Signed)
Anesthesia QCDR form completed.        

## 2017-11-01 NOTE — Op Note (Addendum)
[facility] Gastroenterology Patient Name: Vicki Norris Procedure Date: 11/01/2017 7:45 AM MRN: [id number] Account #: [id number] Date of Birth: 09-25-1940 Admit Type: Outpatient Age: 77 Room: [facility] ENDO ROOM 1 Gender: Female Note Status: Finalized Procedure:            Colonoscopy Indications:          Family history of colonic polyps in a first-degree                        relative, Personal history of colonic polyps Providers:            Lollie Sails, MD Referring MD:         Rubbie Battiest. Iona Beard MD, MD (Referring MD) Medicines:            Monitored Anesthesia Care Complications:        No immediate complications. Procedure:            Pre-Anesthesia Assessment:                       - ASA Grade Assessment: III - A patient with severe                        systemic disease.                       After obtaining informed consent, the colonoscope was                        passed under direct vision. Throughout the procedure,                        the patient's blood pressure, pulse, and oxygen                        saturations were monitored continuously. The                        Colonoscope was introduced through the anus and                        advanced to the the cecum, identified by appendiceal                        orifice and ileocecal valve. The colonoscopy was                        unusually difficult due to restricted mobility of the                        colon and a tortuous colon. Successful completion of                        the procedure was aided by changing the patient to a                        supine position, changing the patient to a prone                        position and using manual pressure. The patient  tolerated the procedure well. The quality of the bowel                        preparation was good. Findings:      Multiple small-mouthed diverticula were found in the sigmoid colon and        descending colon.      A 4 mm polyp was found in the proximal ascending colon. The polyp was       sessile. The polyp was removed with a cold biopsy forceps. Resection and       retrieval were complete.      A 2 mm polyp was found in the cecum. The polyp was sessile. The polyp       was removed with a cold biopsy forceps. Resection and retrieval were       complete.      A 5 mm polyp was found in the descending colon. The polyp was sessile.       The polyp was removed with a cold snare. Resection and retrieval were       complete.      The digital rectal exam was normal, note possible small posterior       rectocele.      there is mild mucosal fragility noted in the right colon, limited to the       mucosa itself with mild barotrauma noted. Impression:           - Diverticulosis in the sigmoid colon and in the                        descending colon.                       - One 4 mm polyp in the proximal ascending colon,                        removed with a cold biopsy forceps. Resected and                        retrieved.                       - One 2 mm polyp in the cecum, removed with a cold                        biopsy forceps. Resected and retrieved.                       - One 5 mm polyp in the descending colon, removed with                        a cold snare. Resected and retrieved. Recommendation:       - Discharge patient to home.                       - Soft diet today, then advance as tolerated to advance                        diet as tolerated.                       - Telephone GI clinic for pathology  results in 1 week.                       - Miralax 1 capful (17 grams) in 8 ounces of water PO                        daily daily.                       - Return to GI clinic in 4 weeks. Procedure Code(s):    --- Professional ---                       [phone], Colonoscopy, flexible; with removal of tumor(s),                        polyp(s), or other lesion(s) by snare  technique                       [id number], 63, Colonoscopy, flexible; with biopsy, single                        or multiple Diagnosis Code(s):    --- Professional ---                       D12.2, Benign neoplasm of ascending colon                       D12.0, Benign neoplasm of cecum                       D12.4, Benign neoplasm of descending colon                       Z83.71, Family history of colonic polyps                       Z86.010, Personal history of colonic polyps                       K57.30, Diverticulosis of large intestine without                        perforation or abscess without bleeding CPT copyright 2016 American Medical Association. All rights reserved. The codes documented in this report are preliminary and upon coder review may  be revised to meet current compliance requirements. Lollie Sails, MD 11/01/2017 9:00:24 AM This report has been signed electronically. Number of Addenda: 0 Note Initiated On: 11/01/2017 7:45 AM Scope Withdrawal Time: 0 hours 13 minutes 6 seconds  Total Procedure Duration: 0 hours 35 minutes 56 seconds       [facility]

## 2017-11-01 NOTE — Transfer of Care (Signed)
Immediate Anesthesia Transfer of Care Note  Patient: Vicki Norris  Procedure(s) Performed: COLONOSCOPY WITH PROPOFOL (N/A ) ESOPHAGOGASTRODUODENOSCOPY (EGD) WITH PROPOFOL (N/A )  Patient Location: PACU  Anesthesia Type:General  Level of Consciousness: awake and sedated  Airway & Oxygen Therapy: Patient Spontanous Breathing and Patient connected to nasal cannula oxygen  Post-op Assessment: Report given to RN and Post -op Vital signs reviewed and stable  Post vital signs: Reviewed and stable  Last Vitals:  Vitals Value Taken Time  BP    Temp    Pulse    Resp    SpO2      Last Pain:  Vitals:   11/01/17 0716  TempSrc: Tympanic         Complications: No apparent anesthesia complications

## 2017-11-01 NOTE — Op Note (Signed)
[facility] Gastroenterology Patient Name: Vicki Norris Procedure Date: 11/01/2017 7:45 AM MRN: [id number] Account #: [id number] Date of Birth: Apr 28, 1941 Admit Type: Outpatient Age: 77 Room: [facility] ENDO ROOM 1 Gender: Female Note Status: Finalized Procedure:            Upper GI endoscopy Indications:          Odynophagia Providers:            Lollie Sails, MD Referring MD:         Rubbie Battiest. Iona Beard MD, MD (Referring MD) Medicines:            Monitored Anesthesia Care Complications:        No immediate complications. Procedure:            Pre-Anesthesia Assessment:                       - ASA Grade Assessment: III - A patient with severe                        systemic disease.                       After obtaining informed consent, the endoscope was                        passed under direct vision. Throughout the procedure,                        the patient's blood pressure, pulse, and oxygen                        saturations were monitored continuously. The Endoscope                        was introduced through the mouth, and advanced to the                        third part of duodenum. The upper GI endoscopy was                        accomplished without difficulty. The patient tolerated                        the procedure well. Findings:      The Z-line was variable. Biopsies were taken with a cold forceps for       histology.      A single 3 mm mucosal nodule with a localized distribution was found in       the lower third of the esophagus. Biopsies were taken with a cold       forceps for histology.      The exam of the esophagus was otherwise normal.      Multiple 1 to 5 mm pedunculated and sessile polyps with no bleeding and       no stigmata of recent bleeding were found in the gastric body. Biopsies       were taken with a cold forceps for histology.      Patchy mild inflammation characterized by congestion (edema), erosions       and  erythema was found in the gastric antrum. Biopsies were taken with a  cold forceps for histology.      Diffuse and patchy mild inflammation characterized by congestion (edema)       and erythema was found in the gastric body. Biopsies were taken with a       cold forceps for histology.      The cardia and gastric fundus were normal on retroflexion otherwise.      The examined duodenum was normal. Impression:           - Z-line variable. Biopsied.                       - Mucosal nodule found in the esophagus. Biopsied.                       - Multiple gastric polyps. Biopsied.                       - Gastritis. Biopsied.                       - Gastritis. Biopsied.                       - Normal examined duodenum. Recommendation:       - Perform a colonoscopy today. Procedure Code(s):    --- Professional ---                       [phone], Esophagogastroduodenoscopy, flexible, transoral;                        with biopsy, single or multiple Diagnosis Code(s):    --- Professional ---                       K22.8, Other specified diseases of esophagus                       K31.7, Polyp of stomach and duodenum                       K29.70, Gastritis, unspecified, without bleeding                       R13.10, Dysphagia, unspecified CPT copyright 2016 American Medical Association. All rights reserved. The codes documented in this report are preliminary and upon coder review may  be revised to meet current compliance requirements. Lollie Sails, MD 11/01/2017 8:15:28 AM This report has been signed electronically. Number of Addenda: 0 Note Initiated On: 11/01/2017 7:45 AM      [facility]

## 2017-11-01 NOTE — H&P (Addendum)
Outpatient short stay form Pre-procedure 11/01/2017 7:42 AM Lollie Sails MD  Primary Physician: Dr Salome Holmes  Reason for visit: EGD and colonoscopy  History of present illness: Patient is a 77 year old female presenting today as above.  There is a family and personal history of colon polyps.  She is also has been having some rectal pain.  This does not awaken her at night.  She does have some issues with constipation and apparently is not taking anything for this at this point.  Have to strain at times to have a bowel movement.  She is also presenting for EGD in regards to history of reflux.  She has complained of a lot of thick mucus that is difficult for her to get rid of.  Is not taking any medications in that regard.  She has taken decongestants which have not improved the situation.  We discussed this at length.  Patient does take 81 mg aspirin daily.  She takes no other aspirin products or blood thinning agent.    Current Facility-Administered Medications:  .  0.9 %  sodium chloride infusion, , Intravenous, Continuous, Lollie Sails, MD, Last Rate: 20 mL/hr at 11/01/17 0732 .  0.9 %  sodium chloride infusion, , Intravenous, Continuous, Lollie Sails, MD .  butamben-tetracaine-benzocaine (CETACAINE) 09-15-12 % spray, , , ,   Medications Prior to Admission  Medication Sig Dispense Refill Last Dose  . ascorbic acid (VITAMIN C) 250 MG CHEW Chew 250 mg by mouth daily.   10/27/2017 at Unknown time  . Carboxymethylcellul-Glycerin (OPTIVE) 0.5-0.9 % SOLN Apply to eye.   10/27/2017 at Unknown time  . cholecalciferol (VITAMIN D) 1000 units tablet Take 1,000 Units by mouth 2 (two) times daily.    10/27/2017 at Unknown time  . cyanocobalamin 1000 MCG tablet Take 1,000 mcg by mouth daily.   10/27/2017 at Unknown time  . Esomeprazole Magnesium (NEXIUM PO) Take 22.3 mg by mouth.   10/27/2017 at Unknown time  . GARLIC PO Take by mouth.   10/27/2017 at Unknown time  . glipiZIDE (GLUCOTROL  XL) 2.5 MG 24 hr tablet Take 2.5 mg by mouth daily with breakfast.   10/27/2017 at Unknown time  . ipratropium (ATROVENT) 0.06 % nasal spray Place 2 sprays into both nostrils 4 (four) times daily. 3-4 times/ day 15 mL 0 10/30/2017 at Unknown time  . Multiple Vitamins-Minerals (MULTIVITAMIN WITH MINERALS) tablet Take 1 tablet by mouth daily.   10/27/2017 at Unknown time  . Omega-3 Fatty Acids (FISH OIL PO) Take 300 mg by mouth.   10/27/2017 at Unknown time  . RABEprazole (ACIPHEX) 20 MG tablet Take 20 mg by mouth daily.   10/27/2017 at Unknown time  . rosuvastatin (CRESTOR) 5 MG tablet Take 5 mg by mouth daily.   10/27/2017 at Unknown time  . aspirin 81 MG tablet Take 81 mg by mouth daily.   Taking  . benzonatate (TESSALON) 200 MG capsule Take 1 capsule (200 mg total) by mouth 3 (three) times daily as needed for cough. (Patient not taking: Reported on 10/31/2017) 30 capsule 0 Completed Course at Unknown time  . chlorpheniramine-HYDROcodone (TUSSIONEX PENNKINETIC ER) 10-8 MG/5ML SUER Take 2.5 mLs by mouth every 12 (twelve) hours as needed for cough. 2.5- 5 mL q 12 hr (Patient not taking: Reported on 10/31/2017) 120 mL 0 Completed Course at Unknown time  . sodium chloride (OCEAN) 0.65 % SOLN nasal spray Place 2 sprays into both nostrils every 2 (two) hours while awake.  0  Allergies  Allergen Reactions  . Citalopram Other (See Comments)    tremors  . Simvastatin Other (See Comments)    myalgias  . Metformin And Related     Constipation,diarrhea  . Pantoprazole Swelling     Past Medical History:  Diagnosis Date  . Anemia   . Depression   . Diabetes mellitus without complication ([place])   . GERD (gastroesophageal reflux disease)   . Head ache   . Osteoarthritis   . Stroke [facility])    TIA  . TIA (transient ischemic attack)     Review of systems:      Physical Exam    Heart and lungs: Regular rate and rhythm without rub or gallop, lungs are bilaterally clear.    HEENT: Normocephalic  atraumatic eyes are anicteric    Other:    Pertinant exam for procedure: Soft nontender nondistended bowel sounds positive normoactive.    Planned proceedures: EGD, colonoscopy and indicated procedures. I have discussed the risks benefits and complications of procedures to include not limited to bleeding, infection, perforation and the risk of sedation and the patient wishes to proceed.    Lollie Sails, MD Gastroenterology 11/01/2017  7:42 AM

## 2017-11-02 ENCOUNTER — Encounter: Payer: Self-pay | Admitting: Gastroenterology

## 2017-11-05 LAB — SURGICAL PATHOLOGY

## 2018-01-22 DIAGNOSIS — M25561 Pain in right knee: Secondary | ICD-10-CM | POA: Insufficient documentation

## 2018-02-06 ENCOUNTER — Ambulatory Visit
Admission: EM | Admit: 2018-02-06 | Discharge: 2018-02-06 | Disposition: A | Discharge status: Home / Self Care | Payer: Medicare PPO | Attending: Emergency Medicine | Admitting: Emergency Medicine

## 2018-02-06 ENCOUNTER — Encounter: Payer: Self-pay | Admitting: Emergency Medicine

## 2018-02-06 ENCOUNTER — Other Ambulatory Visit: Payer: Self-pay

## 2018-02-06 DIAGNOSIS — S61012A Laceration without foreign body of left thumb without damage to nail, initial encounter: Secondary | ICD-10-CM | POA: Diagnosis not present

## 2018-02-06 MED ORDER — BACITRACIN ZINC 500 UNIT/GM EX OINT: 1.0000 "application " | TOPICAL_OINTMENT | Freq: Once | CUTANEOUS | Status: DC

## 2018-02-06 MED ORDER — CEPHALEXIN 500 MG PO CAPS: 1000.0000 mg | ORAL_CAPSULE | Freq: Two times a day (BID) | ORAL | 0 refills | Status: AC

## 2018-02-06 NOTE — Discharge Instructions (Addendum)
Keep this clean and dry as you possibly can.  May apply bacitracin to it intermittently.  This will heal in approximately a week to 10 days.

## 2018-02-06 NOTE — ED Triage Notes (Signed)
Patient c/o cutting her left thumb with a pair of shears around 1200pm today.

## 2018-02-06 NOTE — ED Provider Notes (Signed)
HPI  SUBJECTIVE:  Vicki Norris is a right-handed 77 y.o. female who presents with a small laceration on the distal part of her left thumb.  States that she cut her thumb by accident 2 hours prior to arrival on some dirty pruning shears.  She denies numbness, tingling, foreign body sensation, limitation of motion.  She reports constant, achy, throbbing pain.  She tried applied pressure with hemostasis.  She also applied alcohol and "red oil".  She did not irrigate it with soap and water.  No aggravating factors.  She is on aspirin 81 mg daily.  Also history of diabetes, TIA.  No history of hypertension.  She is not a smoker.  Tetanus is up-to-date-updated 2 years ago.  PMD: Sharyne Peach, MD    Past Medical History:  Diagnosis Date  . Anemia   . Depression   . Diabetes mellitus without complication ([place])   . GERD (gastroesophageal reflux disease)   . Head ache   . Osteoarthritis   . Stroke [facility])    TIA  . TIA (transient ischemic attack)     Past Surgical History:  Procedure Laterality Date  . ABDOMINAL HYSTERECTOMY    . CATARACT EXTRACTION Bilateral   . COLONOSCOPY    . COLONOSCOPY WITH PROPOFOL N/A 11/01/2017   Procedure: COLONOSCOPY WITH PROPOFOL;  Surgeon: Lollie Sails, MD;  Location: [facility] ENDOSCOPY;  Service: Endoscopy;  Laterality: N/A;  . ESOPHAGOGASTRODUODENOSCOPY    . ESOPHAGOGASTRODUODENOSCOPY (EGD) WITH PROPOFOL N/A 06/29/2016   Procedure: ESOPHAGOGASTRODUODENOSCOPY (EGD) WITH PROPOFOL;  Surgeon: Lollie Sails, MD;  Location: [facility] ENDOSCOPY;  Service: Endoscopy;  Laterality: N/A;  . ESOPHAGOGASTRODUODENOSCOPY (EGD) WITH PROPOFOL N/A 11/01/2017   Procedure: ESOPHAGOGASTRODUODENOSCOPY (EGD) WITH PROPOFOL;  Surgeon: Lollie Sails, MD;  Location: [facility] ENDOSCOPY;  Service: Endoscopy;  Laterality: N/A;  . ROTATOR CUFF REPAIR    . TUBAL LIGATION      Family History  Problem Relation Age of Onset  . Breast cancer Sister 37    Social History   Tobacco  Use  . Smoking status: Never Smoker  . Smokeless tobacco: Never Used  Substance Use Topics  . Alcohol use: No    Alcohol/week: 0.6 oz    Types: 1 Cans of beer per week    Comment: occassion  . Drug use: No     Current Facility-Administered Medications:  .  bacitracin ointment 1 application, 1 application, Topical, Once, Melynda Ripple, MD  Current Outpatient Medications:  .  ascorbic acid (VITAMIN C) 250 MG CHEW, Chew 250 mg by mouth daily., Disp: , Rfl:  .  aspirin 81 MG tablet, Take 81 mg by mouth daily., Disp: , Rfl:  .  cholecalciferol (VITAMIN D) 1000 units tablet, Take 1,000 Units by mouth 2 (two) times daily. , Disp: , Rfl:  .  cyanocobalamin 1000 MCG tablet, Take 1,000 mcg by mouth daily., Disp: , Rfl:  .  Esomeprazole Magnesium (NEXIUM PO), Take 22.3 mg by mouth., Disp: , Rfl:  .  GARLIC PO, Take by mouth., Disp: , Rfl:  .  glipiZIDE (GLUCOTROL XL) 2.5 MG 24 hr tablet, Take 2.5 mg by mouth daily with breakfast., Disp: , Rfl:  .  ipratropium (ATROVENT) 0.06 % nasal spray, Place 2 sprays into both nostrils 4 (four) times daily. 3-4 times/ day, Disp: 15 mL, Rfl: 0 .  Multiple Vitamins-Minerals (MULTIVITAMIN WITH MINERALS) tablet, Take 1 tablet by mouth daily., Disp: , Rfl:  .  Omega-3 Fatty Acids (FISH OIL PO), Take 300 mg by  mouth., Disp: , Rfl:  .  RABEprazole (ACIPHEX) 20 MG tablet, Take 20 mg by mouth daily., Disp: , Rfl:  .  rosuvastatin (CRESTOR) 5 MG tablet, Take 5 mg by mouth daily., Disp: , Rfl:  .  cephALEXin (KEFLEX) 500 MG capsule, Take 2 capsules (1,000 mg total) by mouth 2 (two) times daily for 5 days., Disp: 20 capsule, Rfl: 0 .  sodium chloride (OCEAN) 0.65 % SOLN nasal spray, Place 2 sprays into both nostrils every 2 (two) hours while awake., Disp: , Rfl: 0  Allergies  Allergen Reactions  . Citalopram Other (See Comments)    tremors  . Simvastatin Other (See Comments)    myalgias  . Metformin And Related     Constipation,diarrhea  . Pantoprazole  Swelling     ROS  As noted in HPI.   Physical Exam  BP 138/70 (BP Location: Right Arm)   Pulse 76   Temp 98.1 F (36.7 C) (Oral)   Resp 18   Ht 5\' 9"  (1.753 m)   Wt 180 lb (81.6 kg)   SpO2 97%   BMI 26.58 kg/m   Constitutional: Well developed, well nourished, no acute distress Eyes:  EOMI, conjunctiva normal bilaterally HENT: Normocephalic, atraumatic,mucus membranes moist Respiratory: Normal inspiratory effort Cardiovascular: Normal rate GI: nondistended skin: No rash, skin intact Musculoskeletal: 0.5 cm U-shaped laceration distal left thumb.  No active bleeding.  Sensation intact.  Patient has full AROM of the thumb.   Neurologic: Alert & oriented x 3, no focal neuro deficits Psychiatric: Speech and behavior appropriate   ED Course   Medications  bacitracin ointment 1 application (has no administration in time range)    Orders Placed This Encounter  Procedures  . Apply dry sterile dressing    Standing Status:   Standing    Number of Occurrences:   1    No results found for this or any previous visit (from the past 24 hour(s)). No results found.  ED Clinical Impression  Laceration of left thumb without foreign body without damage to nail, initial encounter   ED Assessment/Plan  Scrubbed wound out extensively with chlorhexidine and tap water.  got underneath the skin.  No vascular involvement.  Applied bacitracin and a pressure dressing.  Patient tolerated this well.  Patient's tetanus is up-to-date, states that she had it within 2 years.  Will send home with Keflex for 5 days as prophylaxis because she is an older diabetic.  She is to keep this clean and dry possible.  May apply bacitracin from time to time.  She will return here or follow-up with her primary care physician if it starts to look infected, she will go to the ER for signs of severe infection, fevers above 100.4, or for other concerns.  Discussed MDM, treatment plan, and plan for  follow-up with patient. Discussed sn/sx that should prompt return to the ED. patient agrees with plan.   Meds ordered this encounter  Medications  . bacitracin ointment 1 application  . cephALEXin (KEFLEX) 500 MG capsule    Sig: Take 2 capsules (1,000 mg total) by mouth 2 (two) times daily for 5 days.    Dispense:  20 capsule    Refill:  0    *This clinic note was created using [employer]. Therefore, there may be occasional mistakes despite careful proofreading.   ?   Melynda Ripple, MD 02/06/18 1429

## 2018-06-30 ENCOUNTER — Ambulatory Visit
Admission: EM | Admit: 2018-06-30 | Discharge: 2018-06-30 | Disposition: A | Discharge status: Home / Self Care | Payer: Medicare PPO | Attending: Emergency Medicine | Admitting: Emergency Medicine

## 2018-06-30 ENCOUNTER — Other Ambulatory Visit: Payer: Self-pay

## 2018-06-30 ENCOUNTER — Encounter: Payer: Self-pay | Admitting: Gynecology

## 2018-06-30 DIAGNOSIS — H6503 Acute serous otitis media, bilateral: Secondary | ICD-10-CM

## 2018-06-30 DIAGNOSIS — J0140 Acute pansinusitis, unspecified: Secondary | ICD-10-CM

## 2018-06-30 MED ORDER — IPRATROPIUM BROMIDE 0.06 % NA SOLN: 2.0000 | Freq: Four times a day (QID) | NASAL | 0 refills | Status: DC

## 2018-06-30 MED ORDER — AMOXICILLIN-POT CLAVULANATE 875-125 MG PO TABS: 1.0000 | ORAL_TABLET | Freq: Two times a day (BID) | ORAL | 0 refills | Status: DC

## 2018-06-30 NOTE — ED Provider Notes (Signed)
HPI  SUBJECTIVE:  Vicki Norris is a 77 y.o. female who presents with yellow nasal congestion, rhinorrhea, postnasal drip, sinus pain and pressure, sore throat, swollen cervical lymphadenopathy on the right side, nonproductive cough, wheeze, malaise starting yesterday.  She states that she felt feverish with chills yesterday but did not measure her temperature.  Reports bilateral ear pain, no change in hearing.  Questionable yellow otorrhea.  States that her upper teeth hurt.  No chest pain, shortness of breath, dyspnea on exertion.  She has tried [facility] which helped with the nasal congestion but no change in the postnasal drip.  She states that her symptoms are worse with bending forward, lying down.  She states that her son has identical symptoms and diagnosed with sinusitis.  She has a past medical history of stroke/TIA per epic, and she is not on any anticoagulants or antiplatelets.  Patient denies history of TIA.  Also diabetes, GERD.  No history of hypertension, asthma, emphysema, COPD, smoking, GI bleed, kidney disease.  PMD. Sharyne Peach, MD     Past Medical History:  Diagnosis Date  . Anemia   . Depression   . Diabetes mellitus without complication ([place])   . GERD (gastroesophageal reflux disease)   . Head ache   . Osteoarthritis   . Stroke [facility])    TIA  . TIA (transient ischemic attack)     Past Surgical History:  Procedure Laterality Date  . ABDOMINAL HYSTERECTOMY    . CATARACT EXTRACTION Bilateral   . COLONOSCOPY    . COLONOSCOPY WITH PROPOFOL N/A 11/01/2017   Procedure: COLONOSCOPY WITH PROPOFOL;  Surgeon: Lollie Sails, MD;  Location: [facility] ENDOSCOPY;  Service: Endoscopy;  Laterality: N/A;  . ESOPHAGOGASTRODUODENOSCOPY    . ESOPHAGOGASTRODUODENOSCOPY (EGD) WITH PROPOFOL N/A 06/29/2016   Procedure: ESOPHAGOGASTRODUODENOSCOPY (EGD) WITH PROPOFOL;  Surgeon: Lollie Sails, MD;  Location: [facility] ENDOSCOPY;  Service: Endoscopy;  Laterality: N/A;  .  ESOPHAGOGASTRODUODENOSCOPY (EGD) WITH PROPOFOL N/A 11/01/2017   Procedure: ESOPHAGOGASTRODUODENOSCOPY (EGD) WITH PROPOFOL;  Surgeon: Lollie Sails, MD;  Location: [facility] ENDOSCOPY;  Service: Endoscopy;  Laterality: N/A;  . ROTATOR CUFF REPAIR    . TUBAL LIGATION      Family History  Problem Relation Age of Onset  . Breast cancer Sister 11    Social History   Tobacco Use  . Smoking status: Never Smoker  . Smokeless tobacco: Never Used  Substance Use Topics  . Alcohol use: No    Alcohol/week: 1.0 standard drinks    Types: 1 Cans of beer per week    Comment: occassion  . Drug use: No    No current facility-administered medications for this encounter.   Current Outpatient Medications:  .  ascorbic acid (VITAMIN C) 250 MG CHEW, Chew 250 mg by mouth daily., Disp: , Rfl:  .  aspirin 81 MG tablet, Take 81 mg by mouth daily., Disp: , Rfl:  .  cholecalciferol (VITAMIN D) 1000 units tablet, Take 1,000 Units by mouth 2 (two) times daily. , Disp: , Rfl:  .  cyanocobalamin 1000 MCG tablet, Take 1,000 mcg by mouth daily., Disp: , Rfl:  .  Esomeprazole Magnesium (NEXIUM PO), Take 22.3 mg by mouth., Disp: , Rfl:  .  GARLIC PO, Take by mouth., Disp: , Rfl:  .  glipiZIDE (GLUCOTROL XL) 2.5 MG 24 hr tablet, Take 2.5 mg by mouth daily with breakfast., Disp: , Rfl:  .  Multiple Vitamins-Minerals (MULTIVITAMIN WITH MINERALS) tablet, Take 1 tablet by mouth daily., Disp: ,  Rfl:  .  Omega-3 Fatty Acids (FISH OIL PO), Take 300 mg by mouth., Disp: , Rfl:  .  RABEprazole (ACIPHEX) 20 MG tablet, Take 20 mg by mouth daily., Disp: , Rfl:  .  rosuvastatin (CRESTOR) 5 MG tablet, Take 5 mg by mouth daily., Disp: , Rfl:  .  amoxicillin-clavulanate (AUGMENTIN) 875-125 MG tablet, Take 1 tablet by mouth 2 (two) times daily. X 7 days, Disp: 14 tablet, Rfl: 0 .  ipratropium (ATROVENT) 0.06 % nasal spray, Place 2 sprays into both nostrils 4 (four) times daily. 3-4 times/ day, Disp: 15 mL, Rfl: 0 .  sodium chloride  (OCEAN) 0.65 % SOLN nasal spray, Place 2 sprays into both nostrils every 2 (two) hours while awake., Disp: , Rfl: 0  Allergies  Allergen Reactions  . Citalopram Other (See Comments)    tremors  . Simvastatin Other (See Comments)    myalgias  . Metformin And Related     Constipation,diarrhea  . Pantoprazole Swelling     ROS  As noted in HPI.   Physical Exam  BP (!) 142/73 (BP Location: Left Arm)   Pulse 79   Temp 98.1 F (36.7 C) (Oral)   Resp 16   Wt 83 kg   SpO2 97%   BMI 27.02 kg/m   Constitutional: Well developed, well nourished, no acute distress Eyes:  EOMI, conjunctiva normal bilaterally HENT: Normocephalic, atraumatic,mucus membranes moist.  TMs normal bilaterally, positive serous effusion behind the TMs bilaterally.  EACs normal bilaterally.  Positive frontal and maxillary sinus tenderness.  Erythematous, swollen turbinates.  Positive nasal congestion.  Slightly erythematous oropharynx with postnasal drip. Neck:: Right-sided cervical lymphadenopathy Respiratory: Normal inspiratory effort, lungs clear bilaterally, good air movement Cardiovascular: Normal rate, regular rhythm, no murmurs, rubs, gallops GI: nondistended skin: No rash, skin intact Musculoskeletal: no deformities Neurologic: Alert & oriented x 3, no focal neuro deficits Psychiatric: Speech and behavior appropriate   ED Course   Medications - No data to display  No orders of the defined types were placed in this encounter.   No results found for this or any previous visit (from the past 24 hour(s)). No results found.  ED Clinical Impression  Acute non-recurrent pansinusitis  Non-recurrent acute serous otitis media of both ears   ED Assessment/Plan  Patient with a sinusitis and a  serous otitis media.  Sending home with Augmentin which will cover both, saline nasal irrigation, Atrovent nasal spray as she states that the Flonase did not help with the postnasal drip.  Ibuprofen 200-400 mg  combined with 500 mg of Tylenol together 3 or 4 times a day as needed for pain, Mucinex.  Also Benadryl/Maalox mixture for the sore throat.  Follow-up with her primary care physician as needed.  Meds ordered this encounter  Medications  . amoxicillin-clavulanate (AUGMENTIN) 875-125 MG tablet    Sig: Take 1 tablet by mouth 2 (two) times daily. X 7 days    Dispense:  14 tablet    Refill:  0  . ipratropium (ATROVENT) 0.06 % nasal spray    Sig: Place 2 sprays into both nostrils 4 (four) times daily. 3-4 times/ day    Dispense:  15 mL    Refill:  0    *This clinic note was created using [employer]. Therefore, there may be occasional mistakes despite careful proofreading.   ?   Melynda Ripple, MD 06/30/18 1045

## 2018-06-30 NOTE — ED Triage Notes (Signed)
Patient c/o sinus drainage / cough / swollen gland and ear discomfort.

## 2018-06-30 NOTE — Discharge Instructions (Addendum)
Take the medication as written. Start Mucinex to keep the mucous thin and to decongest you.  Treatment will help with the nasal congestion and postnasal drip.  You may take 200 mg of motrin with  500 mg of tylenol up to 3-4 times a day as needed for pain. This is an effective combination for pain.  Most sinus infections are viral and do not need antibiotics unless you have a high fever, have had this for 10 days, or you get better and then get sick again. Use a NeilMed sinus rinse as often as you want to to reduce nasal congestion. Follow the directions on the box    Make sure you drink plenty of extra fluids.  Some people find salt water gargles and  Tr[employer] Medic[employer]'s "Throat Coat" tea helpful. Take 5 mL of liquid Benadryl and 5 mL of Maalox. Mix it together, and then hold it in your mouth for as long as you can and then swallow. You may do this 3-4 times a day.       Go to www.goodrx.com to look up your medications. This will give you a list of where you can find your prescriptions at the most affordable prices. Or you can ask the pharmacist what the cash price is. This is frequently cheaper than going through insurance.

## 2018-07-20 ENCOUNTER — Ambulatory Visit (INDEPENDENT_AMBULATORY_CARE_PROVIDER_SITE_OTHER): Payer: Medicare PPO

## 2018-07-20 ENCOUNTER — Ambulatory Visit
Admission: EM | Admit: 2018-07-20 | Discharge: 2018-07-20 | Disposition: A | Discharge status: Home / Self Care | Payer: Medicare PPO | Attending: Family Medicine | Admitting: Family Medicine

## 2018-07-20 ENCOUNTER — Encounter: Payer: Self-pay | Admitting: Gynecology

## 2018-07-20 DIAGNOSIS — R05 Cough: Secondary | ICD-10-CM

## 2018-07-20 DIAGNOSIS — J0101 Acute recurrent maxillary sinusitis: Secondary | ICD-10-CM | POA: Diagnosis not present

## 2018-07-20 DIAGNOSIS — J40 Bronchitis, not specified as acute or chronic: Secondary | ICD-10-CM

## 2018-07-20 DIAGNOSIS — J0100 Acute maxillary sinusitis, unspecified: Secondary | ICD-10-CM

## 2018-07-20 LAB — RAPID STREP SCREEN (MED CTR MEBANE ONLY): Streptococcus, Group A Screen (Direct): NEGATIVE

## 2018-07-20 MED ORDER — DOXYCYCLINE HYCLATE 100 MG PO CAPS: 100.0000 mg | ORAL_CAPSULE | Freq: Two times a day (BID) | ORAL | 0 refills | Status: DC

## 2018-07-20 MED ORDER — BENZONATATE 100 MG PO CAPS: 100.0000 mg | ORAL_CAPSULE | Freq: Three times a day (TID) | ORAL | 0 refills | Status: DC | PRN

## 2018-07-20 NOTE — Discharge Instructions (Signed)
Take medication as prescribed. Rest. Drink plenty of fluids.  ° °Follow up with your primary care physician this week. Return to Urgent care for new or worsening concerns.  ° °

## 2018-07-20 NOTE — ED Triage Notes (Signed)
Patient c/o cough /nasal drip. Per patient not better since her last visit on 06/30/2018.

## 2018-07-20 NOTE — ED Provider Notes (Signed)
[facility] ____________________________________________  Time seen: Approximately 9:21 AM  I have reviewed the triage vital signs and the nursing notes.   HISTORY  Chief Complaint Cough  HPI Vicki Norris is a 77 y.o. female presenting for evaluation of approximately 3 weeks sickness time of cough, congestion and drainage complaints.  Patient was initially seen on 06/30/2018 for these complaints and was diagnosed with sinusitis started on oral Augmentin.  Patient states she was taking the antibiotic but symptoms continued in which she was seen by her doctor on 07/05/2018.  At that time it was discussed that was a viral illness and she was directed to continue her antibiotic as well as given PRN Tessalon Perles and PRN Tussionex.  States this did not resolve her complaints that she was seen by her primary again on 07/15/2018 for the persistent cough.  States that that time she was told it was more of a bronchitis and she was given prednisone.  At most recent visit it was recommended for a chest x-ray, but states that she did not feel comfortable driving to go and have her chest x-ray completed so she did not.  Coming in today for the same complaints that have continued.  Patient states that she does not believe that the prednisone that she is currently taking has changed anything.  Has been taken some intermittent Mucinex as well as her Flonase.  States that now in the last few days she is having more sinus pressure and tenderness around her cheeks.  Expresses concern that she is still getting yellow mucus when blowing her nose.  Continues with a tickle in her throat, particularly on the right side that induces cough as well as the cough that disrupts day and night activities.  Denies any known fevers.  No accompanying chest pain or shortness of breath.  Has continue to remain active.  Continues to eat and drink well.  Denies other aggravating alleviating factors.  Denies extremity  swelling.  Reports otherwise doing well.  Sharyne Peach, MD: PCP    Past Medical History:  Diagnosis Date  . Anemia   . Depression   . Diabetes mellitus without complication ([place])   . GERD (gastroesophageal reflux disease)   . Head ache   . Osteoarthritis   . Stroke [facility])    TIA  . TIA (transient ischemic attack)     Patient Active Problem List   Diagnosis Date Noted  . Primary osteoarthritis of right knee 10/26/2016  . Other constipation 06/19/2016  . Hypercholesterolemia 06/08/2016  . New onset type 2 diabetes mellitus ([place]) 06/08/2016  . Vitamin B 12 deficiency 06/08/2016  . Recurrent major depressive disorder ([place]) 04/14/2016  . Headache 09/20/2015  . Depression 01/07/2014  . GERD (gastroesophageal reflux disease) 01/07/2014    Past Surgical History:  Procedure Laterality Date  . ABDOMINAL HYSTERECTOMY    . CATARACT EXTRACTION Bilateral   . COLONOSCOPY    . COLONOSCOPY WITH PROPOFOL N/A 11/01/2017   Procedure: COLONOSCOPY WITH PROPOFOL;  Surgeon: Lollie Sails, MD;  Location: [facility] ENDOSCOPY;  Service: Endoscopy;  Laterality: N/A;  . ESOPHAGOGASTRODUODENOSCOPY    . ESOPHAGOGASTRODUODENOSCOPY (EGD) WITH PROPOFOL N/A 06/29/2016   Procedure: ESOPHAGOGASTRODUODENOSCOPY (EGD) WITH PROPOFOL;  Surgeon: Lollie Sails, MD;  Location: [facility] ENDOSCOPY;  Service: Endoscopy;  Laterality: N/A;  . ESOPHAGOGASTRODUODENOSCOPY (EGD) WITH PROPOFOL N/A 11/01/2017   Procedure: ESOPHAGOGASTRODUODENOSCOPY (EGD) WITH PROPOFOL;  Surgeon: Lollie Sails, MD;  Location: [facility] ENDOSCOPY;  Service: Endoscopy;  Laterality: N/A;  .  ROTATOR CUFF REPAIR    . TUBAL LIGATION       No current facility-administered medications for this encounter.   Current Outpatient Medications:  .  ascorbic acid (VITAMIN C) 250 MG CHEW, Chew 250 mg by mouth daily., Disp: , Rfl:  .  aspirin 81 MG tablet, Take 81 mg by mouth daily., Disp: , Rfl:  .  cholecalciferol (VITAMIN D) 1000 units tablet, Take  1,000 Units by mouth 2 (two) times daily. , Disp: , Rfl:  .  cyanocobalamin 1000 MCG tablet, Take 1,000 mcg by mouth daily., Disp: , Rfl:  .  Esomeprazole Magnesium (NEXIUM PO), Take 22.3 mg by mouth., Disp: , Rfl:  .  GARLIC PO, Take by mouth., Disp: , Rfl:  .  glipiZIDE (GLUCOTROL XL) 2.5 MG 24 hr tablet, Take 2.5 mg by mouth daily with breakfast., Disp: , Rfl:  .  ipratropium (ATROVENT) 0.06 % nasal spray, Place 2 sprays into both nostrils 4 (four) times daily. 3-4 times/ day, Disp: 15 mL, Rfl: 0 .  Multiple Vitamins-Minerals (MULTIVITAMIN WITH MINERALS) tablet, Take 1 tablet by mouth daily., Disp: , Rfl:  .  Omega-3 Fatty Acids (FISH OIL PO), Take 300 mg by mouth., Disp: , Rfl:  .  RABEprazole (ACIPHEX) 20 MG tablet, Take 20 mg by mouth daily., Disp: , Rfl:  .  rosuvastatin (CRESTOR) 5 MG tablet, Take 5 mg by mouth daily., Disp: , Rfl:  .  benzonatate (TESSALON PERLES) 100 MG capsule, Take 1 capsule (100 mg total) by mouth 3 (three) times daily as needed for cough., Disp: 15 capsule, Rfl: 0 .  doxycycline (VIBRAMYCIN) 100 MG capsule, Take 1 capsule (100 mg total) by mouth 2 (two) times daily., Disp: 20 capsule, Rfl: 0 .  sodium chloride (OCEAN) 0.65 % SOLN nasal spray, Place 2 sprays into both nostrils every 2 (two) hours while awake., Disp: , Rfl: 0  Allergies Citalopram; Simvastatin; Metformin and related; and Pantoprazole  Family History  Problem Relation Age of Onset  . Breast cancer Sister 80    Social History Social History   Tobacco Use  . Smoking status: Never Smoker  . Smokeless tobacco: Never Used  Substance Use Topics  . Alcohol use: No    Alcohol/week: 1.0 standard drinks    Types: 1 Cans of beer per week    Comment: occassion  . Drug use: No    Review of Systems Constitutional: No fever Cardiovascular: Denies chest pain. Respiratory: Denies shortness of breath. Gastrointestinal: No abdominal pain.   Musculoskeletal: Negative for back pain. Skin: Negative for  rash.   ____________________________________________   PHYSICAL EXAM:  VITAL SIGNS: ED Triage Vitals  Enc Vitals Group     BP 07/20/18 0833 (!) 147/79     Pulse Rate 07/20/18 0833 74     Resp 07/20/18 0833 16     Temp 07/20/18 0833 98.7 F (37.1 C)     Temp Source 07/20/18 0833 Oral     SpO2 07/20/18 0833 98 %     Weight 07/20/18 0832 180 lb (81.6 kg)     Height --      Head Circumference --      Peak Flow --      Pain Score 07/20/18 0832 8     Pain Loc --      Pain Edu? --      Excl. in [place]? --     Constitutional: Alert and oriented. Well appearing and in no acute distress. Eyes: Conjunctivae are normal.  Head: Atraumatic.Mild to moderate tenderness to palpation bilateral maxillary sinuses.  No frontal sinus tenderness palpation.  No swelling. No erythema.   Ears: no erythema, normal TMs bilaterally.   Nose: nasal congestion with bilateral nasal turbinate erythema and edema.   Mouth/Throat: Mucous membranes are moist.  Oropharynx non-erythematous.No tonsillar swelling or exudate.  Neck: No stridor.  No cervical spine tenderness to palpation. Hematological/Lymphatic/Immunilogical: No cervical lymphadenopathy. Cardiovascular: Normal rate, regular rhythm. Grossly normal heart sounds.  Good peripheral circulation. Respiratory: Normal respiratory effort.  No retractions.  No wheezes, rales or rhonchi. Good air movement.  Musculoskeletal: Steady gait.  No lower extremity edema noted. Neurologic:  Normal speech and language. No gross focal neurologic deficits are appreciated. No gait instability. Skin:  Skin is warm, dry and intact. No rash noted. Psychiatric: Mood and affect are normal. Speech and behavior are normal.  ___________________________________________   LABS (all labs ordered are listed, but only abnormal results are displayed)  Labs Reviewed  RAPID STREP SCREEN ([facility] ONLY)  CULTURE, GROUP A STREP [facility])    RADIOLOGY  Dg Chest 2 View  Result  Date: 07/20/2018 CLINICAL DATA:  Cough x three weeks EXAM: CHEST - 2 VIEW COMPARISON:  10/18/2015 FINDINGS: Midline trachea. Normal heart size and mediastinal contours. No pleural effusion or pneumothorax. Clear lungs. IMPRESSION: Normal chest. Electronically Signed   By: Abigail Miyamoto M.D.   On: 07/20/2018 09:31   ____________________________________________   PROCEDURES Procedures    INITIAL IMPRESSION / ASSESSMENT AND PLAN / ED COURSE  Pertinent labs & imaging results that were available during my care of the patient were reviewed by me and considered in my medical decision making (see chart for details).  Well-appearing patient.  No acute distress.  Suspect recent viral illnesses including bronchitis.  Concern for possible secondary sinusitis again at this time.  Also has no chest x-ray completed thus far will evaluate chest x-ray.  Chest x-ray as above per radiologist, negative.  Discussed this in detail with patient.  Will put patient on doxycycline as concern for sinusitis, PRN Tessalon Perles.  Also suspect bronchitis.  Encourage supportive care.  Follow-up with primary care next week.Discussed indication, risks and benefits of medications with patient.  Discussed follow up and return parameters including no resolution or any worsening concerns. Patient verbalized understanding and agreed to plan.   ____________________________________________   FINAL CLINICAL IMPRESSION(S) / ED DIAGNOSES  Final diagnoses:  Bronchitis  Acute maxillary sinusitis, recurrence not specified     ED Discharge Orders         Ordered    doxycycline (VIBRAMYCIN) 100 MG capsule  2 times daily     07/20/18 0958    benzonatate (TESSALON PERLES) 100 MG capsule  3 times daily PRN     07/20/18 1002           Note: This dictation was prepared with Dragon dictation along with smaller phrase technology. Any transcriptional errors that result from this process are unintentional.         Marylene Land, NP 07/20/18 1011

## 2018-07-23 LAB — CULTURE, GROUP A STREP (THRC)

## 2019-09-06 ENCOUNTER — Other Ambulatory Visit: Payer: Self-pay

## 2019-09-06 ENCOUNTER — Encounter: Payer: Self-pay | Admitting: Emergency Medicine

## 2019-09-06 ENCOUNTER — Ambulatory Visit
Admission: EM | Admit: 2019-09-06 | Discharge: 2019-09-06 | Disposition: A | Discharge status: Home / Self Care | Payer: Medicare Other

## 2019-09-06 DIAGNOSIS — J069 Acute upper respiratory infection, unspecified: Secondary | ICD-10-CM

## 2019-09-06 DIAGNOSIS — H6591 Unspecified nonsuppurative otitis media, right ear: Secondary | ICD-10-CM

## 2019-09-06 DIAGNOSIS — H748X3 Other specified disorders of middle ear and mastoid, bilateral: Secondary | ICD-10-CM

## 2019-09-06 MED ORDER — BENZONATATE 100 MG PO CAPS: 100.0000 mg | ORAL_CAPSULE | Freq: Three times a day (TID) | ORAL | 0 refills | Status: DC | PRN

## 2019-09-06 MED ORDER — CETIRIZINE-PSEUDOEPHEDRINE ER 5-120 MG PO TB12: 1.0000 | ORAL_TABLET | Freq: Every day | ORAL | 0 refills | Status: DC

## 2019-09-06 NOTE — ED Triage Notes (Signed)
Pt c/o cough, sore throat, sore in nose, runny nose, hoarseness and sinus pain/pressure. Started yesterday morning. Denies fever, shortness of breath or body aches. She states the sore throat has resolved. She states she was tested for covid through the health department yesterday but she has not gotten the results yet.

## 2019-09-06 NOTE — Discharge Instructions (Signed)
It was very nice seeing you today in clinic. Thank you for entrusting me with your care.   Rest and increase fluid intake. COVID test pending through health department. Take Zyrtec-D as prescribed. Cough medication as needed. Please utilize the medications that we discussed. Your prescriptions has been called in to your pharmacy.   Make arrangements to follow up with your regular doctor in 1 week for re-evaluation if not improving. If your symptoms/condition worsens, please seek follow up care either here or in the ER. Please remember, our [facility] providers are "right here with you" when you need [place].   Again, it was my pleasure to take care of you today. Thank you for choosing our clinic. I hope that you start to feel better quickly.   Honor Loh, MSN, APRN, FNP-C, CEN Advanced Practice Provider [facility]

## 2019-09-06 NOTE — ED Provider Notes (Addendum)
[place], [place]   Name: Vicki Norris DOB: 09-18-1940 MRN: [id number] CSN: [id number] PCP: Sofie Hartigan, MD  Arrival date and time:  09/06/19 0941  Chief Complaint:  Sore Throat and Cough   NOTE: Prior to seeing the patient today, I have reviewed the triage nursing documentation and vital signs. Clinical staff has updated patient's PMH/PSHx, current medication list, and drug allergies/intolerances to ensure comprehensive history available to assist in medical decision making.   History:   HPI: Vicki Norris is a 79 y.o. female who presents today with complaints of cough, congestions, rhinorrhea, sinus pressure, post nasal drip, and RIGHT ear fullness that began with acute onset yesterday. Patient denies fevers, chills, headaches, and myalgias. Cough has been non-productive with no associated shortness of breath or wheezing. PMH (+) seasonal allergies, however patient does not use any type of daily interventions. She denies that she has experienced any nausea, vomiting, diarrhea, or abdominal pain. She is eating and drinking well. Patient denies any perceived alterations to her sense of taste or smell. Patient denies being in close contact with anyone known to be ill; no one else is her home has experienced a similar symptom constellation. She was tested for SARS-CoV-2 (novel coronavirus) at the health department yesterday; results pending. Patient has not been vaccinated for influenza this season. In efforts to conservatively manage her symptoms at home, the patient notes that she has used APAP and [place] and [place] whiskey with honey, which have helped to improve her symptoms some.   Past Medical History:  Diagnosis Date  . Anemia   . Depression   . Diabetes mellitus without complication ([place])   . GERD (gastroesophageal reflux disease)   . Head ache   . Osteoarthritis   . Stroke [facility])    TIA  . TIA (transient ischemic attack)     Past Surgical History:  Procedure Laterality  Date  . ABDOMINAL HYSTERECTOMY    . CATARACT EXTRACTION Bilateral   . COLONOSCOPY    . COLONOSCOPY WITH PROPOFOL N/A 11/01/2017   Procedure: COLONOSCOPY WITH PROPOFOL;  Surgeon: Lollie Sails, MD;  Location[facility] ENDOSCOPY;  Service: Endoscopy;  Laterality: N/A;  . ESOPHAGOGASTRODUODENOSCOPY    . ESOPHAGOGASTRODUODENOSCOPY (EGD) WITH PROPOFOL N/A 06/29/2016   Procedure: ESOPHAGOGASTRODUODENOSCOPY (EGD) WITH PROPOFOL;  Surgeon: Lollie Sails, MD;  Location[facility] ENDOSCOPY;  Service: Endoscopy;  Laterality: N/A;  . ESOPHAGOGASTRODUODENOSCOPY (EGD) WITH PROPOFOL N/A 11/01/2017   Procedure: ESOPHAGOGASTRODUODENOSCOPY (EGD) WITH PROPOFOL;  Surgeon: Lollie Sails, MD;  Location: [facility] ENDOSCOPY;  Service: Endoscopy;  Laterality: N/A;  . ROTATOR CUFF REPAIR    . TUBAL LIGATION      Family History  Problem Relation Age of Onset  . Breast cancer Sister 68    Social History   Tobacco Use  . Smoking status: Never Smoker  . Smokeless tobacco: Never Used  Substance Use Topics  . Alcohol use: No    Alcohol/week: 1.0 standard drinks    Types: 1 Cans of beer per week    Comment: occassion  . Drug use: No    Patient Active Problem List   Diagnosis Date Noted  . Primary osteoarthritis of right knee 10/26/2016  . Other constipation 06/19/2016  . Hypercholesterolemia 06/08/2016  . New onset type 2 diabetes mellitus ([place]) 06/08/2016  . Vitamin B 12 deficiency 06/08/2016  . Recurrent major depressive disorder ([place]) 04/14/2016  . Headache 09/20/2015  . Depression 01/07/2014  . GERD (gastroesophageal reflux disease) 01/07/2014    Home Medications:  Current Meds  Medication Sig  . ascorbic acid (VITAMIN C) 250 MG CHEW Chew 250 mg by mouth daily.  [name] aspirin 81 MG tablet Take 81 mg by mouth daily.  . cholecalciferol (VITAMIN D) 1000 units tablet Take 1,000 Units by mouth 2 (two) times daily.   . cyanocobalamin 1000 MCG tablet Take 1,000 mcg by mouth daily.  [name] glipiZIDE  (GLUCOTROL XL) 2.5 MG 24 hr tablet Take 2.5 mg by mouth daily with breakfast.  . Multiple Vitamins-Minerals (MULTIVITAMIN WITH MINERALS) tablet Take 1 tablet by mouth daily.  . Omega-3 Fatty Acids (FISH OIL PO) Take 300 mg by mouth.  . RABEprazole (ACIPHEX) 20 MG tablet Take 20 mg by mouth daily.  . rosuvastatin (CRESTOR) 5 MG tablet Take 5 mg by mouth daily.    Allergies:   Citalopram, Simvastatin, Metformin and related, and Pantoprazole  Review of Systems (ROS): Review of Systems  Constitutional: Negative for fatigue and fever.  HENT: Positive for congestion, ear pain (muffles hearing), postnasal drip, rhinorrhea and sinus pressure. Negative for ear discharge, sinus pain, sneezing and sore throat.   Eyes: Negative for pain, discharge and redness.  Respiratory: Positive for cough. Negative for chest tightness and shortness of breath.   Cardiovascular: Negative for chest pain and palpitations.  Gastrointestinal: Negative for abdominal pain, diarrhea, nausea and vomiting.  Musculoskeletal: Negative for arthralgias, back pain, myalgias and neck pain.  Skin: Negative for color change, pallor and rash.  Allergic/Immunologic: Positive for environmental allergies.  Neurological: Negative for dizziness, syncope, weakness and headaches.  Hematological: Negative for adenopathy.     Vital Signs: Today's Vitals   09/06/19 1001 09/06/19 1002 09/06/19 1008 09/06/19 1029  BP:   (!) 152/70   Pulse:   78   Resp:   18   Temp:   98.4 F (36.9 C)   TempSrc:   Oral   SpO2:   97%   Weight:  185 lb (83.9 kg)    Height:  5\' 9"  (1.753 m)    PainSc: 0-No pain   0-No pain    Physical Exam: Physical Exam  Constitutional: She is oriented to person, place, and time and well-developed, well-nourished, and in no distress.  HENT:  Head: Normocephalic and atraumatic.  Right Ear: Tympanic membrane is injected. Tympanic membrane is not bulging. A middle ear effusion (mild serous) is present. Decreased  hearing is noted.  Left Ear: Tympanic membrane normal.  Nose: Mucosal edema, rhinorrhea and sinus tenderness (paranasal pressure) present.  Mouth/Throat: Uvula is midline and mucous membranes are normal. Posterior oropharyngeal erythema (mild with (+) clear PND) present. No oropharyngeal exudate or posterior oropharyngeal edema.  Eyes: Pupils are equal, round, and reactive to light.  Cardiovascular: Normal rate, regular rhythm, normal heart sounds and intact distal pulses.  Pulmonary/Chest: Effort normal and breath sounds normal.  Moderate cough noted in clinic. No SOB or increased WOB. No distress. Able to speak in complete sentences without difficulties. SPO2 97% on RA.  Neurological: She is alert and oriented to person, place, and time. Gait normal.  Skin: Skin is warm and dry. No rash noted. She is not diaphoretic.  Psychiatric: Mood, memory, affect and judgment normal.  Nursing note and vitals reviewed.   Urgent Care Treatments / Results:   No orders of the defined types were placed in this encounter.   LABS: PLEASE NOTE: all labs that were ordered this encounter are listed, however only abnormal results are displayed. Labs Reviewed - No data to display  EKG: -None  RADIOLOGY: No results found.  PROCEDURES: Procedures  MEDICATIONS RECEIVED THIS VISIT: Medications - No data to display  PERTINENT CLINICAL COURSE NOTES/UPDATES:   Initial Impression / Assessment and Plan / Urgent Care Course:  Pertinent labs & imaging results that were available during my care of the patient were personally reviewed by me and considered in my medical decision making (see lab/imaging section of note for values and interpretations).  Vicki Norris is a 79 y.o. female who presents to [facility] today with complaints of Sore Throat and Cough  Patient overall well appearing and in no acute distress today in clinic. Presenting symptoms (see HPI) and exam as documented above. She  presents with symptoms associated with SARS-CoV-2 (novel coronavirus). Patient tested yesterday at health department; results pending.   Presenting symptoms consistent with acute viral illness. Acute onset yesterday. Until ruled out with confirmatory lab testing, SARS-CoV-2 remains part of the differential. I discussed with her that her symptoms are felt to be viral in nature, thus antibiotics would not offer her any relief or improve his symptoms any faster than conservative symptomatic management. Discussed supportive care measures at home during acute phase of illness. Patient advised to avoid forceful nose blowing to prevent worsening MEE. Rx sent in for Zyrtec-D to help with congestion and help dry up ear effusion. Additionally, will send in a supply of benzonatate (Tessalon) for patient to use on a PRN basis to help with her cough. Patient to rest as much as possible. She was encouraged to ensure adequate hydration (water and ORS) to prevent dehydration and electrolyte derangements. Patient may use APAP and/or IBU on an as needed basis for pain/fever. Patient has multiple co-morbidities that increase her SARS-CoV-2 morbidity and mortality risk including her age and T2DM. If patient found to be positive for SARS-CoV-2, her co-morbidities should qualify him for the monoclonal antibody (bamlanivimab). She is followed by [facility], which is offering mAb infusions in [place]. Encouraged to call PCP's office if health department SARS-CoV-2 testing is positive to discuss criteria and eligibility for the infusion treatment.    Discussed follow up with primary care physician in 1 week for re-evaluation. I have reviewed the follow up and strict return precautions for any new or worsening symptoms. Patient is aware of symptoms that would be deemed urgent/emergent, and would thus require further evaluation either here or in the emergency department. At the time of discharge, she verbalized understanding and  consent with the discharge plan as it was reviewed with her. All questions were fielded by provider and/or clinic staff prior to patient discharge.    Final Clinical Impressions / Urgent Care Diagnoses:   Final diagnoses:  Viral URI with cough  MEE (middle ear effusion), right    New Prescriptions:  [place] Controlled Substance Registry consulted? Not Applicable  Meds ordered this encounter  Medications  . benzonatate (TESSALON PERLES) 100 MG capsule    Sig: Take 1 capsule (100 mg total) by mouth 3 (three) times daily as needed for cough.    Dispense:  15 capsule    Refill:  0  . cetirizine-pseudoephedrine (ZYRTEC-D) 5-120 MG tablet    Sig: Take 1 tablet by mouth daily.    Dispense:  30 tablet    Refill:  0    Recommended Follow up Care:  Patient encouraged to follow up with the following provider within the specified time frame, or sooner as dictated by the severity of her symptoms. As always, she was instructed that for any urgent/emergent  care needs, she should seek care either here or in the emergency department for more immediate evaluation.  Follow-up Information    Sharyne Peach, MD In 1 week.   Specialty: Family Medicine Why: General reassessment of symptoms if not improving Contact information: [address] [place] [address] [phone]         NOTE: This note was prepared using Dragon dictation software along with smaller phrase technology. Despite my best ability to proofread, there is the potential that transcriptional errors may still occur from this process, and are completely unintentional.     Karen Kitchens, NP 09/06/19 2302

## 2019-09-08 ENCOUNTER — Other Ambulatory Visit: Payer: Self-pay

## 2019-09-08 ENCOUNTER — Ambulatory Visit
Admission: EM | Admit: 2019-09-08 | Discharge: 2019-09-08 | Disposition: A | Discharge status: Home / Self Care | Payer: Medicare Other | Attending: Family Medicine | Admitting: Family Medicine

## 2019-09-08 DIAGNOSIS — R05 Cough: Secondary | ICD-10-CM | POA: Diagnosis not present

## 2019-09-08 DIAGNOSIS — R0981 Nasal congestion: Secondary | ICD-10-CM

## 2019-09-08 DIAGNOSIS — J069 Acute upper respiratory infection, unspecified: Secondary | ICD-10-CM

## 2019-09-08 MED ORDER — HYDROCOD POLST-CPM POLST ER 10-8 MG/5ML PO SUER: 5.0000 mL | Freq: Every evening | ORAL | 0 refills | Status: DC | PRN

## 2019-09-08 NOTE — Discharge Instructions (Addendum)
Take medication as prescribed.  Continue Mucinex.  Rest. Drink plenty of fluids.   As discussed if not improving within 2 days please have refollow-up.  Follow up with your primary care physician this week as needed. Return to Urgent care or ER for new or worsening concerns.

## 2019-09-08 NOTE — ED Triage Notes (Signed)
Pt with sinus symptoms, sore throat and cough since Thursday. Drainage down back of throat. Had negative COVID test over the weekend. Wants something to dry up her secretions. Was given Tessalon without relief of cough. Now hurts from coughing so much

## 2019-09-08 NOTE — ED Provider Notes (Addendum)
[facility] ____________________________________________  Time seen: Approximately 10:44 AM  I have reviewed the triage vital signs and the nursing notes.   HISTORY  Chief Complaint Cough   HPI Vicki Norris is a 79 y.o. female presenting for evaluation of 4 days of cough and congestion complaints.  Patient reports symptoms started on Thursday with congestion and then over the weekend increased to having more cough and congestion.  Denies sore throat, changes in taste or smell, fevers, chest pain or shortness of breath.  States cough disrupts her sleep and is a dry hacking cough.  Reports she was seen this past Friday and was given Tessalon Perles without resolution.  Reports that she did have outpatient COVID-19 testing at the trailer outside of [place] this past Friday and reports her COVID-19 test was negative.  Offered repeat Covid testing today, patient declined.  Denies hemoptysis, extremity edema.  Patient is requesting cough syrup to assist with her coughing.  Did take Mucinex yesterday which helped dry up her nose some.  Denies other aggravating alleviating factors.  Sofie Hartigan, MD : PCP    Past Medical History:  Diagnosis Date  . Anemia   . Depression   . Diabetes mellitus without complication ([place])   . GERD (gastroesophageal reflux disease)   . Head ache   . Osteoarthritis   . Stroke [facility])    TIA  . TIA (transient ischemic attack)     Patient Active Problem List   Diagnosis Date Noted  . Primary osteoarthritis of right knee 10/26/2016  . Other constipation 06/19/2016  . Hypercholesterolemia 06/08/2016  . New onset type 2 diabetes mellitus ([place]) 06/08/2016  . Vitamin B 12 deficiency 06/08/2016  . Recurrent major depressive disorder ([place]) 04/14/2016  . Headache 09/20/2015  . Depression 01/07/2014  . GERD (gastroesophageal reflux disease) 01/07/2014    Past Surgical History:  Procedure Laterality Date  . ABDOMINAL HYSTERECTOMY    .  CATARACT EXTRACTION Bilateral   . COLONOSCOPY    . COLONOSCOPY WITH PROPOFOL N/A 11/01/2017   Procedure: COLONOSCOPY WITH PROPOFOL;  Surgeon: Lollie Sails, MD;  Location: [facility] ENDOSCOPY;  Service: Endoscopy;  Laterality: N/A;  . ESOPHAGOGASTRODUODENOSCOPY    . ESOPHAGOGASTRODUODENOSCOPY (EGD) WITH PROPOFOL N/A 06/29/2016   Procedure: ESOPHAGOGASTRODUODENOSCOPY (EGD) WITH PROPOFOL;  Surgeon: Lollie Sails, MD;  Location: [facility] ENDOSCOPY;  Service: Endoscopy;  Laterality: N/A;  . ESOPHAGOGASTRODUODENOSCOPY (EGD) WITH PROPOFOL N/A 11/01/2017   Procedure: ESOPHAGOGASTRODUODENOSCOPY (EGD) WITH PROPOFOL;  Surgeon: Lollie Sails, MD;  Location: [facility] ENDOSCOPY;  Service: Endoscopy;  Laterality: N/A;  . ROTATOR CUFF REPAIR    . TUBAL LIGATION       No current facility-administered medications for this encounter.  Current Outpatient Medications:  .  ascorbic acid (VITAMIN C) 250 MG CHEW, Chew 250 mg by mouth daily., Disp: , Rfl:  .  aspirin 81 MG tablet, Take 81 mg by mouth daily., Disp: , Rfl:  .  benzonatate (TESSALON PERLES) 100 MG capsule, Take 1 capsule (100 mg total) by mouth 3 (three) times daily as needed for cough., Disp: 15 capsule, Rfl: 0 .  cetirizine-pseudoephedrine (ZYRTEC-D) 5-120 MG tablet, Take 1 tablet by mouth daily., Disp: 30 tablet, Rfl: 0 .  chlorpheniramine-HYDROcodone (TUSSIONEX PENNKINETIC ER) 10-8 MG/5ML SUER, Take 5 mLs by mouth at bedtime as needed for cough. do not drive or operate machinery while taking as can cause drowsiness., Disp: 40 mL, Rfl: 0 .  cholecalciferol (VITAMIN D) 1000 units tablet, Take 1,000 Units by mouth 2 (  two) times daily. , Disp: , Rfl:  .  cyanocobalamin 1000 MCG tablet, Take 1,000 mcg by mouth daily., Disp: , Rfl:  .  glipiZIDE (GLUCOTROL XL) 2.5 MG 24 hr tablet, Take 2.5 mg by mouth daily with breakfast., Disp: , Rfl:  .  Milk Thistle Extract 175 MG TABS, Take by mouth., Disp: , Rfl:  .  Multiple Vitamins-Minerals (MULTIVITAMIN WITH  MINERALS) tablet, Take 1 tablet by mouth daily., Disp: , Rfl:  .  Omega-3 Fatty Acids (FISH OIL PO), Take 300 mg by mouth., Disp: , Rfl:  .  RABEprazole (ACIPHEX) 20 MG tablet, Take 20 mg by mouth daily., Disp: , Rfl:  .  rosuvastatin (CRESTOR) 5 MG tablet, Take 5 mg by mouth daily., Disp: , Rfl:   Allergies Citalopram, Simvastatin, Metformin and related, and Pantoprazole  Family History  Problem Relation Age of Onset  . Breast cancer Sister 39    Social History Social History   Tobacco Use  . Smoking status: Never Smoker  . Smokeless tobacco: Never Used  Substance Use Topics  . Alcohol use: No    Alcohol/week: 1.0 standard drinks    Types: 1 Cans of beer per week    Comment: occassion  . Drug use: No    Review of Systems Constitutional: No fever/chills Eyes: No visual changes. ENT: No sore throat.  Positive congestion. Cardiovascular: Denies chest pain. Respiratory: Denies shortness of breath.  Positive cough Gastrointestinal: No abdominal pain.  No nausea, no vomiting.  No diarrhea.   Musculoskeletal: States some soreness in her back from coughing. Skin: Negative for rash.    ____________________________________________   PHYSICAL EXAM:  VITAL SIGNS: ED Triage Vitals  Enc Vitals Group     BP 09/08/19 0958 (!) 145/65     Pulse Rate 09/08/19 0958 75     Resp 09/08/19 0958 17     Temp 09/08/19 0958 98.4 F (36.9 C)     Temp Source 09/08/19 0958 Oral     SpO2 09/08/19 0958 96 %     Weight 09/08/19 1000 184 lb 15.5 oz (83.9 kg)     Height 09/08/19 1000 5\' 9"  (1.753 m)     Head Circumference --      Peak Flow --      Pain Score 09/08/19 0959 6     Pain Loc --      Pain Edu? --      Excl. in [place]? --     Constitutional: Alert and oriented. Well appearing and in no acute distress. Eyes: Conjunctivae are normal.  Head: Atraumatic. No swelling. No erythema.  Nose:Nasal congestion Hematological/Lymphatic/Immunilogical: No cervical  lymphadenopathy. Cardiovascular: Normal rate, regular rhythm. Grossly normal heart sounds.  Good peripheral circulation. Respiratory: Normal respiratory effort.  No retractions. No wheezes, rales or rhonchi. Good air movement.  Occasional dry cough noted in room. Musculoskeletal: Ambulatory with steady gait.  No lower extremity edema bilaterally noted.  Neurologic:  Normal speech and language. No gait instability. Skin:  Skin appears warm, dry and intact. No rash noted. Psychiatric: Mood and affect are normal. Speech and behavior are normal.  ___________________________________________   LABS (all labs ordered are listed, but only abnormal results are displayed)  Labs Reviewed - No data to display   Via care everywhere noted 09/05/2019 negative COVID-19 testing. ____________________________________________  PROCEDURES Procedures     INITIAL IMPRESSION / ASSESSMENT AND PLAN / ED COURSE  Pertinent labs & imaging results that were available during my care of the patient were reviewed  by me and considered in my medical decision making (see chart for details).  Overall well-appearing patient.  No acute distress.  Suspect viral illness.  Patient reports having a outpatient Covid 19 test this past weekend which was negative.  Offered to repeat COVID-19 testing today, patient declined.  Also offered evaluation by chest x-ray due to continued cough and congestion, patient declined.  Recommend for patient to continue over-the-counter Mucinex, Tessalon Perles during the day and as needed Tussionex given for nighttime cough.  Patient reports she has taken Tussionex in the past and tolerated very well.  Discussed very strict follow-up and reevaluation, including follow-up if no improvement.Discussed indication, risks and benefits of medications with patient.   Discussed follow up with Primary care physician this week. Discussed follow up and return parameters including no resolution or any worsening  concerns. Patient verbalized understanding and agreed to plan.   ____________________________________________   FINAL CLINICAL IMPRESSION(S) / ED DIAGNOSES  Final diagnoses:  Viral URI with cough     ED Discharge Orders         Ordered    chlorpheniramine-HYDROcodone (TUSSIONEX PENNKINETIC ER) 10-8 MG/5ML SUER  At bedtime PRN     09/08/19 1031           Note: This dictation was prepared with Dragon dictation along with smaller phrase technology. Any transcriptional errors that result from this process are unintentional.         Marylene Land, NP 09/08/19 1101

## 2020-04-27 ENCOUNTER — Other Ambulatory Visit: Payer: Self-pay | Admitting: Gastroenterology

## 2020-04-27 DIAGNOSIS — R1084 Generalized abdominal pain: Secondary | ICD-10-CM

## 2020-04-27 DIAGNOSIS — K5900 Constipation, unspecified: Secondary | ICD-10-CM

## 2020-05-03 ENCOUNTER — Other Ambulatory Visit: Payer: Self-pay

## 2020-05-03 ENCOUNTER — Ambulatory Visit
Admission: RE | Admit: 2020-05-03 | Discharge: 2020-05-03 | Disposition: A | Discharge status: Home / Self Care | Payer: Medicare Other | Source: Ambulatory Visit | Attending: Gastroenterology | Admitting: Gastroenterology

## 2020-05-03 ENCOUNTER — Other Ambulatory Visit
Admission: RE | Admit: 2020-05-03 | Discharge: 2020-05-03 | Disposition: A | Discharge status: Home / Self Care | Payer: Medicare Other | Source: Home / Self Care | Attending: Gastroenterology | Admitting: Gastroenterology

## 2020-05-03 DIAGNOSIS — R1084 Generalized abdominal pain: Secondary | ICD-10-CM | POA: Insufficient documentation

## 2020-05-03 DIAGNOSIS — K5900 Constipation, unspecified: Secondary | ICD-10-CM | POA: Insufficient documentation

## 2020-05-03 LAB — CREATININE, SERUM
Creatinine, Ser: 0.96 mg/dL (ref 0.44–1.00)
GFR calc Af Amer: >60 mL/min (ref >60)
GFR calc non Af Amer: 57 mL/min — ABNORMAL LOW (ref >60)

## 2020-05-03 MED ORDER — IOHEXOL 300 MG/ML  SOLN
100.0000 mL | Freq: Once | INTRAMUSCULAR | Status: AC | PRN
  Administered 2020-05-03: 100 mL via INTRAVENOUS

## 2020-06-11 ENCOUNTER — Ambulatory Visit (INDEPENDENT_AMBULATORY_CARE_PROVIDER_SITE_OTHER): Payer: Medicare Other | Admitting: Urology

## 2020-06-11 ENCOUNTER — Encounter: Payer: Self-pay | Admitting: Urology

## 2020-06-11 ENCOUNTER — Other Ambulatory Visit: Payer: Self-pay

## 2020-06-11 VITALS — BP 119/72 | HR 87 | Ht 69.0 in | Wt 185.0 lb

## 2020-06-11 DIAGNOSIS — R350 Frequency of micturition: Secondary | ICD-10-CM

## 2020-06-11 DIAGNOSIS — N390 Urinary tract infection, site not specified: Secondary | ICD-10-CM

## 2020-06-11 DIAGNOSIS — R3915 Urgency of urination: Secondary | ICD-10-CM

## 2020-06-11 LAB — URINALYSIS, COMPLETE
Bilirubin, UA: NEGATIVE
Glucose, UA: NEGATIVE
Ketones, UA: NEGATIVE
Nitrite, UA: NEGATIVE
Protein,UA: NEGATIVE
Specific Gravity, UA: 1.02 (ref 1.005–1.030)
Urobilinogen, Ur: 0.2 mg/dL (ref 0.2–1.0)
pH, UA: 7 (ref 5.0–7.5)

## 2020-06-11 LAB — MICROSCOPIC EXAMINATION: Bacteria, UA: NONE SEEN

## 2020-06-11 LAB — BLADDER SCAN AMB NON-IMAGING

## 2020-06-11 NOTE — Patient Instructions (Addendum)

## 2020-06-11 NOTE — Progress Notes (Signed)
06/11/2020 10:26 AM   Levander Campion Fredna Dow 07-27-1941 [id number]  Referring provider: Leonel Ramsay, MD [place],  [place] [address]  Chief Complaint  Patient presents with  . Urinary Tract Infection    HPI:  Diane was referred over for urinary tract infection.  It sounds like she took Cipro and Flagyl for diverticulitis and then developed the typical cycle of urinary tract infection afterwards, although a lot of her symptoms dont change with abx. She had some symptoms of incontinence.  Culture grew Pseudomonas.  It had intermediate sensitivities to Cipro.  She was treated with fosfomycin.  She saw Dr. Ola Spurr in ID and the last culture was negative.  CT scan 05/03/2020 which was benign.  She c/o urgency and nocturia. No dysuria. No gross hematuria. She has constipation. She has pelvic pain and vaginal pain. She has abdominal pain. She has low back pain. Standing makes all this worse. She has no prolapse. Again, abx do not help these symptoms. She had dyspareunia and stopped having sex.   Her PVR is 0. She is status post hysterectomy. She has a GYN appt in Nov. No breast ca or blood clots. Maybe TV estrogen would be an option.   PMH: Past Medical History:  Diagnosis Date  . Anemia   . Depression   . Diabetes mellitus without complication ([place])   . GERD (gastroesophageal reflux disease)   . Head ache   . Osteoarthritis   . Stroke [facility])    TIA  . TIA (transient ischemic attack)     Surgical History: Past Surgical History:  Procedure Laterality Date  . ABDOMINAL HYSTERECTOMY    . CATARACT EXTRACTION Bilateral   . COLONOSCOPY    . COLONOSCOPY WITH PROPOFOL N/A 11/01/2017   Procedure: COLONOSCOPY WITH PROPOFOL;  Surgeon: Lollie Sails, MD;  Location: [facility] ENDOSCOPY;  Service: Endoscopy;  Laterality: N/A;  . ESOPHAGOGASTRODUODENOSCOPY    . ESOPHAGOGASTRODUODENOSCOPY (EGD) WITH PROPOFOL N/A 06/29/2016   Procedure: ESOPHAGOGASTRODUODENOSCOPY (EGD)  WITH PROPOFOL;  Surgeon: Lollie Sails, MD;  Location: [facility] ENDOSCOPY;  Service: Endoscopy;  Laterality: N/A;  . ESOPHAGOGASTRODUODENOSCOPY (EGD) WITH PROPOFOL N/A 11/01/2017   Procedure: ESOPHAGOGASTRODUODENOSCOPY (EGD) WITH PROPOFOL;  Surgeon: Lollie Sails, MD;  Location: [facility] ENDOSCOPY;  Service: Endoscopy;  Laterality: N/A;  . ROTATOR CUFF REPAIR    . TUBAL LIGATION      Home Medications:  Allergies as of 06/11/2020      Reactions   Citalopram Other (See Comments)   tremors   Simvastatin Other (See Comments)   myalgias   Metformin And Related    Constipation,diarrhea   Pantoprazole Swelling      Medication List       Accurate as of June 11, 2020 10:26 AM. If you have any questions, ask your nurse or doctor.        ascorbic acid 250 MG Chew Commonly known as: VITAMIN C Chew 250 mg by mouth daily.   aspirin 81 MG tablet Take 81 mg by mouth daily.   benzonatate 100 MG capsule Commonly known as: Tessalon Perles Take 1 capsule (100 mg total) by mouth 3 (three) times daily as needed for cough.   cetirizine-pseudoephedrine 5-120 MG tablet Commonly known as: ZYRTEC-D Take 1 tablet by mouth daily.   chlorpheniramine-HYDROcodone 10-8 MG/5ML Suer Commonly known as: Tussionex Pennkinetic ER Take 5 mLs by mouth at bedtime as needed for cough. do not drive or operate machinery while taking as can cause drowsiness.   cholecalciferol 1000  units tablet Commonly known as: VITAMIN D Take 1,000 Units by mouth 2 (two) times daily.   cyanocobalamin 1000 MCG tablet Take 1,000 mcg by mouth daily.   FISH OIL PO Take 300 mg by mouth.   glipiZIDE 2.5 MG 24 hr tablet Commonly known as: GLUCOTROL XL Take 2.5 mg by mouth daily with breakfast.   Milk Thistle Extract 175 MG Tabs Take by mouth.   multivitamin with minerals tablet Take 1 tablet by mouth daily.   RABEprazole 20 MG tablet Commonly known as: ACIPHEX Take 20 mg by mouth daily.   rosuvastatin 5 MG  tablet Commonly known as: CRESTOR Take 5 mg by mouth daily.       Allergies:  Allergies  Allergen Reactions  . Citalopram Other (See Comments)    tremors  . Simvastatin Other (See Comments)    myalgias  . Metformin And Related     Constipation,diarrhea  . Pantoprazole Swelling    Family History: Family History  Problem Relation Age of Onset  . Breast cancer Sister 47    Social History:  reports that she has never smoked. She has never used smokeless tobacco. She reports that she does not drink alcohol and does not use drugs.   Physical Exam: BP 119/72 (BP Location: Left Arm, Patient Position: Sitting, Cuff Size: Normal)   Pulse 87   Ht 5\' 9"  (1.753 m)   Wt 185 lb (83.9 kg)   BMI 27.32 kg/m   Constitutional:  Alert and oriented, No acute distress. HEENT: [place] AT, moist mucus membranes.  Trachea midline, no masses. Cardiovascular: No clubbing, cyanosis, or edema. Respiratory: Normal respiratory effort, no increased work of breathing. GI: Abdomen is soft, nontender, nondistended, no abdominal masses GU: No CVA tenderness Skin: No rashes, bruises or suspicious lesions. Neurologic: Grossly intact, no focal deficits, moving all 4 extremities. Psychiatric: Normal mood and affect.  Laboratory Data: Lab Results  Component Value Date   WBC 5.3 08/31/2017   HGB 12.6 08/31/2017   HCT 37.1 08/31/2017   MCV 91.0 08/31/2017   PLT 270 08/31/2017    Lab Results  Component Value Date   CREATININE 0.96 05/03/2020    No results found for: PSA  No results found for: TESTOSTERONE  No results found for: HGBA1C  Urinalysis No results found for: COLORURINE, APPEARANCEUR, LABSPEC, PHURINE, GLUCOSEU, HGBUR, BILIRUBINUR, KETONESUR, PROTEINUR, UROBILINOGEN, NITRITE, LEUKOCYTESUR  No results found for: LABMICR, [place], RBCUA, LABEPIT, MUCUS, BACTERIA  Pertinent Imaging: CT No results found for this or any previous visit.  No results found for this or any previous  visit.  No results found for this or any previous visit.  No results found for this or any previous visit.  No results found for this or any previous visit.  No results found for this or any previous visit.  No results found for this or any previous visit.  No results found for this or any previous visit.   Assessment & Plan:    1. Urinary tract infection without hematuria, site unspecified Hopefully this will settle down.  I think she got in the typical cycle of antibiotics used for a legitimate reason wipes out all of the bladder colonization and urinary tract infection is common.  Hopefully she is getting out of the cycle and can recolonize.  She does not seem to have any urologic predisposition for UTI with negative imaging and a normal post void. I encouraged her to drink plenty of water and take probiotics.  She could also take  d-mannose supplement. - Urinalysis, Complete - Bladder Scan (Post Void Residual) in office  2. Freq/urgency - she's not a good candidate for anticholinergics given the constipation. I did give her some samples of beta 3 agonist.   3. Pelvic pain - will follow-up for exam and cysto. She might be a good candidate for PT. These symptoms are not likely related to UTI. She can try [place].    No follow-ups on file.  Festus Aloe, MD  [facility] [address] [place], [place] [address] [phone]

## 2020-07-07 ENCOUNTER — Other Ambulatory Visit: Payer: Self-pay

## 2020-07-07 ENCOUNTER — Ambulatory Visit: Payer: Medicare Other | Admitting: Urology

## 2020-07-07 ENCOUNTER — Encounter: Payer: Self-pay | Admitting: Urology

## 2020-07-07 VITALS — BP 166/72 | HR 83 | Ht 69.0 in | Wt 184.0 lb

## 2020-07-07 DIAGNOSIS — R3915 Urgency of urination: Secondary | ICD-10-CM | POA: Diagnosis not present

## 2020-07-07 DIAGNOSIS — N390 Urinary tract infection, site not specified: Secondary | ICD-10-CM | POA: Diagnosis not present

## 2020-07-07 DIAGNOSIS — R8271 Bacteriuria: Secondary | ICD-10-CM | POA: Diagnosis not present

## 2020-07-07 MED ORDER — MIRABEGRON ER 25 MG PO TB24: 25.0000 mg | ORAL_TABLET | Freq: Every day | ORAL | 11 refills | Status: DC

## 2020-07-07 NOTE — Progress Notes (Signed)
07/07/2020 9:18 AM   Vicki Norris 03-26-1941 [id number]  Referring provider: Sofie Hartigan, MD [place],   [address]  Chief Complaint  Patient presents with  . Cysto    HPI:  F/u -   1) urgency, UUI and noc - seen Oct 2021 - she c/o urgency, incontinence, nocturia, and pelvic pain. She also c/o constipation and abd pain. She c/o back pain when she stands. She had taken Cipro and Flagyl for diverticulitis. A urine culture grew Pseudomonas.  It had intermediate sensitivities to Cipro.  She was treated with fosfomycin.  She saw Dr. Ola Spurr in ID and the last culture was negative. She was referred to GU for eval. Her LUTS did not seem to change after abx use (likely asymptomatic bacteruria).  As far as her LUTS, standing makes all this worse. She has no prolapse symptoms. She had dyspareunia and stopped having sex. She c/o constipation.   She is status post hysterectomy. She has a GYN appt in Dec 2021. No breast ca or blood clots. Maybe TV estrogen would be an option.   CT scan 05/03/2020 which was benign. Office PVR was 0.   She was given samples of Myrbetriq Oct 2021.   She returns today for cystoscopy and LUTS management. Her UA shows moderate bacteria, > 30 wbc, 3-10 rbc. Again, today she denies dysuria likely indicating she has recolonized appropriately. The myrbetriq did help her frequency and urgency. She continues to c/o pain when she stands for long periods in her back and periumbilical pain.   PMH: Past Medical History:  Diagnosis Date  . Anemia   . Depression   . Diabetes mellitus without complication ([place])   . GERD (gastroesophageal reflux disease)   . Head ache   . Osteoarthritis   . Stroke [facility])    TIA  . TIA (transient ischemic attack)     Surgical History: Past Surgical History:  Procedure Laterality Date  . ABDOMINAL HYSTERECTOMY    . CATARACT EXTRACTION Bilateral   . COLONOSCOPY    . COLONOSCOPY WITH PROPOFOL N/A  11/01/2017   Procedure: COLONOSCOPY WITH PROPOFOL;  Surgeon: Lollie Sails, MD;  Location: [facility] ENDOSCOPY;  Service: Endoscopy;  Laterality: N/A;  . ESOPHAGOGASTRODUODENOSCOPY    . ESOPHAGOGASTRODUODENOSCOPY (EGD) WITH PROPOFOL N/A 06/29/2016   Procedure: ESOPHAGOGASTRODUODENOSCOPY (EGD) WITH PROPOFOL;  Surgeon: Lollie Sails, MD;  Location: [facility] ENDOSCOPY;  Service: Endoscopy;  Laterality: N/A;  . ESOPHAGOGASTRODUODENOSCOPY (EGD) WITH PROPOFOL N/A 11/01/2017   Procedure: ESOPHAGOGASTRODUODENOSCOPY (EGD) WITH PROPOFOL;  Surgeon: Lollie Sails, MD;  Location: [facility] ENDOSCOPY;  Service: Endoscopy;  Laterality: N/A;  . ROTATOR CUFF REPAIR    . TUBAL LIGATION      Home Medications:  Allergies as of 07/07/2020      Reactions   Citalopram Other (See Comments)   tremors   Simvastatin Other (See Comments)   myalgias   Metformin And Related    Constipation,diarrhea   Pantoprazole Swelling      Medication List       Accurate as of July 07, 2020  9:18 AM. If you have any questions, ask your nurse or doctor.        ascorbic acid 250 MG Chew Commonly known as: VITAMIN C Chew 250 mg by mouth daily.   aspirin 81 MG tablet Take 81 mg by mouth daily.   benzonatate 100 MG capsule Commonly known as: Tessalon Perles Take 1 capsule (100 mg total) by mouth 3 (three) times daily  as needed for cough.   cetirizine-pseudoephedrine 5-120 MG tablet Commonly known as: ZYRTEC-D Take 1 tablet by mouth daily.   chlorpheniramine-HYDROcodone 10-8 MG/5ML Suer Commonly known as: Tussionex Pennkinetic ER Take 5 mLs by mouth at bedtime as needed for cough. do not drive or operate machinery while taking as can cause drowsiness.   cholecalciferol 1000 units tablet Commonly known as: VITAMIN D Take 1,000 Units by mouth 2 (two) times daily.   cyanocobalamin 1000 MCG tablet Take 1,000 mcg by mouth daily.   FISH OIL PO Take 300 mg by mouth.   glipiZIDE 2.5 MG 24 hr tablet Commonly  known as: GLUCOTROL XL Take 2.5 mg by mouth daily with breakfast.   lansoprazole 30 MG capsule Commonly known as: PREVACID Take 30 mg by mouth daily.   meloxicam 7.5 MG tablet Commonly known as: MOBIC Take 7.5 mg by mouth 2 (two) times daily.   Milk Thistle Extract 175 MG Tabs Take by mouth.   multivitamin with minerals tablet Take 1 tablet by mouth daily.   RABEprazole 20 MG tablet Commonly known as: ACIPHEX Take 20 mg by mouth daily.   rosuvastatin 5 MG tablet Commonly known as: CRESTOR Take 5 mg by mouth daily.       Allergies:  Allergies  Allergen Reactions  . Citalopram Other (See Comments)    tremors  . Simvastatin Other (See Comments)    myalgias  . Metformin And Related     Constipation,diarrhea  . Pantoprazole Swelling    Family History: Family History  Problem Relation Age of Onset  . Breast cancer Sister 42    Social History:  reports that she has never smoked. She has never used smokeless tobacco. She reports that she does not drink alcohol and does not use drugs.   Physical Exam: BP (!) 166/72   Pulse 83   Ht 5\' 9"  (1.753 m)   Wt 184 lb (83.5 kg)   BMI 27.17 kg/m   Constitutional:  Alert and oriented, No acute distress. HEENT: [place] AT, moist mucus membranes.  Trachea midline, no masses. Cardiovascular: No clubbing, cyanosis, or edema. Respiratory: Normal respiratory effort, no increased work of breathing. GI: Abdomen is soft, nontender, nondistended, no abdominal masses Skin: No rashes, bruises or suspicious lesions. Neurologic: Grossly intact, no focal deficits, moving all 4 extremities. Psychiatric: Normal mood and affect. GU: Normal external genitalia with patent urethral meatus. Mod vaginal atrophy. Good support. No prolapse.    Chaperone for exam and cysto - Jessica   Cystoscopy Procedure Note  Patient identification was confirmed, informed consent was obtained, and patient was prepped using Betadine solution.  Lidocaine jelly was  administered per urethral meatus.    Procedure: - Flexible cystoscope introduced, without any difficulty.   - Thorough search of the bladder revealed:    normal urethral meatus    normal urothelium    no stones    no ulcers     no tumors    no urethral polyps    no trabeculation  - Ureteral orifices were normal in position and appearance.  Post-Procedure: - Patient tolerated the procedure well   Laboratory Data: Lab Results  Component Value Date   WBC 5.3 08/31/2017   HGB 12.6 08/31/2017   HCT 37.1 08/31/2017   MCV 91.0 08/31/2017   PLT 270 08/31/2017    Lab Results  Component Value Date   CREATININE 0.96 05/03/2020    No results found for: PSA  No results found for: TESTOSTERONE  No results found  for: HGBA1C  Urinalysis    Component Value Date/Time   APPEARANCEUR Hazy (A) 06/11/2020 0951   GLUCOSEU Negative 06/11/2020 0951   BILIRUBINUR Negative 06/11/2020 0951   PROTEINUR Negative 06/11/2020 0951   NITRITE Negative 06/11/2020 0951   LEUKOCYTESUR 2+ (A) 06/11/2020 0951    Lab Results  Component Value Date   LABMICR See below: 06/11/2020   WBCUA 11-30 (A) 06/11/2020   LABEPIT 0-10 06/11/2020   BACTERIA None seen 06/11/2020    Pertinent Imaging: n/a No results found for this or any previous visit.  No results found for this or any previous visit.  No results found for this or any previous visit.  No results found for this or any previous visit.  No results found for this or any previous visit.  No results found for this or any previous visit.  No results found for this or any previous visit.  No results found for this or any previous visit.   Assessment & Plan:    1. Urgency, incontinence - I sent a Rx for Myrbetriq 25 mg. Will continue for a few months.   2. Bacteriuria - again asymptomatic and doesn't need treatment. Normal cysto and exam. Pt c/o dyspareunia and yellowish vaginal discharge. She will discuss with her Gyn next month.     No follow-ups on file.  Festus Aloe, MD  [facility] [address] [place], [place] [address] [phone]

## 2020-07-13 LAB — URINALYSIS, COMPLETE
Bilirubin, UA: NEGATIVE
Glucose, UA: NEGATIVE
Ketones, UA: NEGATIVE
Nitrite, UA: NEGATIVE
Protein,UA: NEGATIVE
Specific Gravity, UA: 1.02 (ref 1.005–1.030)
Urobilinogen, Ur: 0.2 mg/dL (ref 0.2–1.0)
pH, UA: 6.5 (ref 5.0–7.5)

## 2020-07-13 LAB — MICROSCOPIC EXAMINATION: WBC, UA: >30 /hpf — AB (ref 0–5)

## 2020-08-08 ENCOUNTER — Encounter: Payer: Self-pay | Admitting: Emergency Medicine

## 2020-08-08 ENCOUNTER — Other Ambulatory Visit: Payer: Self-pay

## 2020-08-08 ENCOUNTER — Ambulatory Visit
Admission: EM | Admit: 2020-08-08 | Discharge: 2020-08-08 | Disposition: A | Discharge status: Home / Self Care | Payer: Medicare Other | Attending: Sports Medicine | Admitting: Sports Medicine

## 2020-08-08 DIAGNOSIS — Z20822 Contact with and (suspected) exposure to covid-19: Secondary | ICD-10-CM | POA: Diagnosis not present

## 2020-08-08 DIAGNOSIS — R059 Cough, unspecified: Secondary | ICD-10-CM | POA: Diagnosis not present

## 2020-08-08 DIAGNOSIS — J028 Acute pharyngitis due to other specified organisms: Secondary | ICD-10-CM

## 2020-08-08 DIAGNOSIS — J069 Acute upper respiratory infection, unspecified: Secondary | ICD-10-CM

## 2020-08-08 DIAGNOSIS — Z7984 Long term (current) use of oral hypoglycemic drugs: Secondary | ICD-10-CM | POA: Insufficient documentation

## 2020-08-08 DIAGNOSIS — Z7982 Long term (current) use of aspirin: Secondary | ICD-10-CM | POA: Insufficient documentation

## 2020-08-08 DIAGNOSIS — Z791 Long term (current) use of non-steroidal anti-inflammatories (NSAID): Secondary | ICD-10-CM | POA: Diagnosis not present

## 2020-08-08 DIAGNOSIS — Z90710 Acquired absence of both cervix and uterus: Secondary | ICD-10-CM | POA: Insufficient documentation

## 2020-08-08 DIAGNOSIS — Z8673 Personal history of transient ischemic attack (TIA), and cerebral infarction without residual deficits: Secondary | ICD-10-CM | POA: Insufficient documentation

## 2020-08-08 DIAGNOSIS — B9789 Other viral agents as the cause of diseases classified elsewhere: Secondary | ICD-10-CM

## 2020-08-08 DIAGNOSIS — M791 Myalgia, unspecified site: Secondary | ICD-10-CM | POA: Diagnosis not present

## 2020-08-08 DIAGNOSIS — Z888 Allergy status to other drugs, medicaments and biological substances status: Secondary | ICD-10-CM | POA: Insufficient documentation

## 2020-08-08 DIAGNOSIS — Z79899 Other long term (current) drug therapy: Secondary | ICD-10-CM | POA: Diagnosis not present

## 2020-08-08 DIAGNOSIS — R051 Acute cough: Secondary | ICD-10-CM | POA: Diagnosis present

## 2020-08-08 LAB — RESP PANEL BY RT-PCR (FLU A&B, COVID) ARPGX2
Influenza A by PCR: NEGATIVE
Influenza B by PCR: NEGATIVE
SARS Coronavirus 2 by RT PCR: NEGATIVE

## 2020-08-08 MED ORDER — BENZONATATE 100 MG PO CAPS
100.0000 mg | ORAL_CAPSULE | Freq: Three times a day (TID) | ORAL | 0 refills | Status: DC
Start: 2020-08-08 — End: 2020-10-20

## 2020-08-08 NOTE — ED Provider Notes (Addendum)
[facility]    CSN: [id number] Arrival date & time: 08/08/20  0855      History   Chief Complaint Chief Complaint  Patient presents with  . Cough    HPI Vicki Norris is a 79 y.o. female.   Patient is a pleasant 79 year old female who presents for for evaluation of the above complaints.  She reports 3 days of cough congestion sore throat.  Her son had a recent sinus infection and she thinks she got her symptoms from him.  She has associated myalgia.  That said she does not have any Covid exposure.  She has been vaccinated against Covid and has received the booster as well.  No flu vaccine.  No history of asthma smoking or COPD.  No significant chest pain or shortness of breath.  She says "I just feel really bad".  No significant red flag signs or symptoms.  Vitals are all stable.     Past Medical History:  Diagnosis Date  . Anemia   . Depression   . Diabetes mellitus without complication ([place])   . GERD (gastroesophageal reflux disease)   . Head ache   . Osteoarthritis   . Stroke [facility])    TIA  . TIA (transient ischemic attack)     Patient Active Problem List   Diagnosis Date Noted  . Primary osteoarthritis of right knee 10/26/2016  . Other constipation 06/19/2016  . Hypercholesterolemia 06/08/2016  . New onset type 2 diabetes mellitus ([place]) 06/08/2016  . Vitamin B 12 deficiency 06/08/2016  . Recurrent major depressive disorder ([place]) 04/14/2016  . Headache 09/20/2015  . Depression 01/07/2014  . GERD (gastroesophageal reflux disease) 01/07/2014    Past Surgical History:  Procedure Laterality Date  . ABDOMINAL HYSTERECTOMY    . CATARACT EXTRACTION Bilateral   . COLONOSCOPY    . COLONOSCOPY WITH PROPOFOL N/A 11/01/2017   Procedure: COLONOSCOPY WITH PROPOFOL;  Surgeon: Lollie Sails, MD;  Location: [facility] ENDOSCOPY;  Service: Endoscopy;  Laterality: N/A;  . ESOPHAGOGASTRODUODENOSCOPY    . ESOPHAGOGASTRODUODENOSCOPY (EGD) WITH PROPOFOL N/A  06/29/2016   Procedure: ESOPHAGOGASTRODUODENOSCOPY (EGD) WITH PROPOFOL;  Surgeon: Lollie Sails, MD;  Location: [facility] ENDOSCOPY;  Service: Endoscopy;  Laterality: N/A;  . ESOPHAGOGASTRODUODENOSCOPY (EGD) WITH PROPOFOL N/A 11/01/2017   Procedure: ESOPHAGOGASTRODUODENOSCOPY (EGD) WITH PROPOFOL;  Surgeon: Lollie Sails, MD;  Location: [facility] ENDOSCOPY;  Service: Endoscopy;  Laterality: N/A;  . ROTATOR CUFF REPAIR    . TUBAL LIGATION      OB History   No obstetric history on file.      Home Medications    Prior to Admission medications   Medication Sig Start Date End Date Taking? Authorizing Provider  ascorbic acid (VITAMIN C) 250 MG CHEW Chew 250 mg by mouth daily.   Yes [provider]  aspirin 81 MG tablet Take 81 mg by mouth daily.   Yes [provider]  cholecalciferol (VITAMIN D) 1000 units tablet Take 1,000 Units by mouth 2 (two) times daily.    Yes [provider]  cyanocobalamin 1000 MCG tablet Take 1,000 mcg by mouth daily.   Yes [provider]  glipiZIDE (GLUCOTROL XL) 2.5 MG 24 hr tablet Take 2.5 mg by mouth daily with breakfast.   Yes [provider]  lansoprazole (PREVACID) 30 MG capsule Take 30 mg by mouth daily. 05/20/20  Yes [provider]  meloxicam (MOBIC) 7.5 MG tablet Take 7.5 mg by mouth 2 (two) times daily. 05/04/20  Yes [provider]  Milk Thistle Extract 175 MG TABS Take by mouth.   Yes [provider]  mirabegron ER (MYRBETRIQ) 25 MG TB24 tablet Take 1 tablet (25 mg total) by mouth daily. 07/07/20  Yes Festus Aloe, MD  Multiple Vitamins-Minerals (MULTIVITAMIN WITH MINERALS) tablet Take 1 tablet by mouth daily.   Yes [provider]  RABEprazole (ACIPHEX) 20 MG tablet Take 20 mg by mouth daily.   Yes [provider]  rosuvastatin (CRESTOR) 5 MG tablet Take 5 mg by mouth daily.   Yes [provider]  benzonatate (TESSALON PERLES) 100 MG capsule Take 1  capsule (100 mg total) by mouth 3 (three) times daily as needed for cough. Patient not taking: Reported on 07/07/2020 09/06/19   Karen Kitchens, NP  cetirizine-pseudoephedrine (ZYRTEC-D) 5-120 MG tablet Take 1 tablet by mouth daily. 09/06/19   Karen Kitchens, NP  chlorpheniramine-HYDROcodone [facility] PENNKINETIC ER) 10-8 MG/5ML SUER Take 5 mLs by mouth at bedtime as needed for cough. do not drive or operate machinery while taking as can cause drowsiness. 09/08/19   Marylene Land, NP  Omega-3 Fatty Acids (FISH OIL PO) Take 300 mg by mouth.    [provider]  Esomeprazole Magnesium (NEXIUM PO) Take 22.3 mg by mouth.  09/06/19  [provider]  ipratropium (ATROVENT) 0.06 % nasal spray Place 2 sprays into both nostrils 4 (four) times daily. 3-4 times/ day 06/30/18 09/06/19  Melynda Ripple, MD  sodium chloride (OCEAN) 0.65 % SOLN nasal spray Place 2 sprays into both nostrils every 2 (two) hours while awake. 09/12/15 09/06/19  Betancourt, Aura Fey, NP    Family History Family History  Problem Relation Age of Onset  . Breast cancer Sister 48    Social History Social History   Tobacco Use  . Smoking status: Never Smoker  . Smokeless tobacco: Never Used  Vaping Use  . Vaping Use: Never used  Substance Use Topics  . Alcohol use: No    Alcohol/week: 1.0 standard drink    Types: 1 Cans of beer per week    Comment: occassion  . Drug use: No     Allergies   Citalopram, Simvastatin, Metformin and related, and Pantoprazole   Review of Systems Review of Systems  Constitutional: Negative for chills and fatigue.  HENT: Positive for congestion, postnasal drip, sinus pain and sore throat. Negative for ear pain and rhinorrhea.   Respiratory: Positive for cough.   Cardiovascular: Negative for chest pain.  Gastrointestinal: Negative for abdominal pain.  Genitourinary: Negative for dysuria.  Musculoskeletal: Positive for myalgias.  Skin: Negative for color change, pallor, rash  and wound.  Neurological: Positive for headaches. Negative for dizziness, seizures, syncope, weakness, light-headedness and numbness.  All other systems reviewed and are negative.    Physical Exam Triage Vital Signs ED Triage Vitals  Enc Vitals Group     BP 08/08/20 0924 (!) 148/76     Pulse Rate 08/08/20 0924 78     Resp 08/08/20 0924 14     Temp 08/08/20 0924 98.8 F (37.1 C)     Temp Source 08/08/20 0924 Oral     SpO2 08/08/20 0924 95 %     Weight 08/08/20 0919 184 lb (83.5 kg)     Height 08/08/20 0919 5\' 9"  (1.753 m)     Head Circumference --      Peak Flow --      Pain Score 08/08/20 0919 3     Pain Loc --      Pain  Edu? --      Excl. in [place]? --    No data found.  Updated Vital Signs BP (!) 148/76 (BP Location: Left Arm)   Pulse 78   Temp 98.8 F (37.1 C) (Oral)   Resp 14   Ht 5\' 9"  (1.753 m)   Wt 83.5 kg   SpO2 95%   BMI 27.17 kg/m   Visual Acuity Right Eye Distance:   Left Eye Distance:   Bilateral Distance:    Right Eye Near:   Left Eye Near:    Bilateral Near:     Physical Exam Vitals and nursing note reviewed.  Constitutional:      General: She is not in acute distress.    Appearance: Normal appearance. She is ill-appearing. She is not toxic-appearing.  HENT:     Head: Normocephalic and atraumatic.     Right Ear: Tympanic membrane normal.     Left Ear: Tympanic membrane normal.     Nose: Congestion present. No rhinorrhea.     Mouth/Throat:     Mouth: Mucous membranes are moist.     Pharynx: Posterior oropharyngeal erythema present. No oropharyngeal exudate.  Eyes:     Extraocular Movements: Extraocular movements intact.     Pupils: Pupils are equal, round, and reactive to light.  Cardiovascular:     Rate and Rhythm: Normal rate and regular rhythm.     Pulses: Normal pulses.     Heart sounds: Normal heart sounds. No murmur heard. No friction rub. No gallop.   Pulmonary:     Effort: Pulmonary effort is normal. No respiratory distress.      Breath sounds: Normal breath sounds. No stridor. No wheezing, rhonchi or rales.  Musculoskeletal:     Cervical back: Normal range of motion and neck supple. No rigidity.  Lymphadenopathy:     Cervical: Cervical adenopathy present.  Skin:    General: Skin is warm and dry.     Capillary Refill: Capillary refill takes less than 2 seconds.  Neurological:     Mental Status: She is alert.      UC Treatments / Results  Labs (all labs ordered are listed, but only abnormal results are displayed) Labs Reviewed  RESP PANEL BY RT-PCR (FLU A&B, COVID) [id number]    EKG   Radiology No results found.  Procedures Procedures (including critical care time)  Medications Ordered in UC Medications - No data to display  Initial Impression / Assessment and Plan / UC Course  I have reviewed the triage vital signs and the nursing notes.  Pertinent labs & imaging results that were available during my care of the patient were reviewed by me and considered in my medical decision making (see chart for details).  Clinical impression: 3 days of upper respiratory symptoms including cough congestion store floor and myalgia.  Treatment plan:  1 the findings and treatment plan were discussed in detail with the patient.  Patient was in agreement. 2.  I felt it prudent to go ahead and test her for Covid and influenza.  Her test was negative today. 3.  Recommended supportive care including plenty of rest plenty of fluids, Tylenol or Motrin for fever discomfort. 4.  Given her vitals and her exam findings I did not feel as though she needed an antibiotic.  If she has true sinusitis she is only had symptoms for about 3 days.  She just monitor her course.  She can use saline flushes, and Nettie pot, and over-the-counter remedies. Rx Tessalon Pearles  for cough. 5.  If she was to worsen in any way she should come back for reevaluation.  Certainly if her symptoms increased and worsened to the point where she needed a  higher level of care she should go to the emergency room. 6.  Follow-up here as needed.     Final Clinical Impressions(s) / UC Diagnoses   Final diagnoses:  Viral URI with cough  Cough  Myalgia  Sore throat (viral)     Discharge Instructions     I felt it prudent to go ahead and test her for Covid and influenza.  Her test was negative today. Recommended supportive care including plenty of rest plenty of fluids, Tylenol or Motrin for fever discomfort. Given her vitals and her exam findings I did not feel as though she needed an antibiotic.  If she has true sinusitis she is only had symptoms for about 3 days.  She just monitor her course.  She can use saline flushes, and Nettie pot, and over-the-counter remedies. If she was to worsen in any way she should come back for reevaluation.  Certainly if her symptoms increased and worsened to the point where she needed a higher level of care she should go to the emergency room. Follow-up here as needed.    ED Prescriptions    None     PDMP not reviewed this encounter.   Verda Cumins, MD 08/08/20 1022    Verda Cumins, MD 08/08/20 1026

## 2020-08-08 NOTE — ED Triage Notes (Signed)
Patient c/o cough, chest congestion, bodyaches, and runny nose that started on Saturday.  Patient denies fevers.

## 2020-08-08 NOTE — Discharge Instructions (Addendum)
I felt it prudent to go ahead and test her for Covid and influenza.  Her test was negative today. Recommended supportive care including plenty of rest plenty of fluids, Tylenol or Motrin for fever discomfort. Given her vitals and her exam findings I did not feel as though she needed an antibiotic.  If she has true sinusitis she is only had symptoms for about 3 days.  She just monitor her course.  She can use saline flushes, and Nettie pot, and over-the-counter remedies. Rx Tessalon Pearles for cough. If she was to worsen in any way she should come back for reevaluation.  Certainly if her symptoms increased and worsened to the point where she needed a higher level of care she should go to the emergency room. Follow-up here as needed.

## 2020-08-26 ENCOUNTER — Telehealth: Payer: Self-pay

## 2020-08-26 NOTE — Telephone Encounter (Signed)
Patient called stating her husband passed last month and since does not have insurance coverage at this time. [facility] $46 for the Myrbetriq is to expensive for her fixed income. She is currently having increased urinary symptoms with out taking the medication. She saw Dr. Ellison Hughs today who is treating her for a UTI. She was told that she could come to the office and pick up a months worth of samples and keep her apt next month to discuss treatment options for the future. Patient verbalized understanding

## 2020-09-22 ENCOUNTER — Telehealth: Payer: Self-pay | Admitting: Urology

## 2020-09-22 NOTE — Telephone Encounter (Signed)
Patient called the office this morning requesting her urology records to be sent to [facility] in [place].  Since her husband's passing, she has changed insurance and [facility] is in network for her.  Notes printed and faxed to [phone].

## 2020-10-07 ENCOUNTER — Ambulatory Visit: Payer: Self-pay | Admitting: Urology

## 2020-10-20 ENCOUNTER — Encounter: Payer: Self-pay | Admitting: Urology

## 2020-10-20 ENCOUNTER — Other Ambulatory Visit: Payer: Self-pay

## 2020-10-20 ENCOUNTER — Ambulatory Visit: Payer: Medicare Other | Admitting: Urology

## 2020-10-20 VITALS — BP 144/75 | HR 87 | Ht 69.0 in | Wt 186.0 lb

## 2020-10-20 DIAGNOSIS — R3915 Urgency of urination: Secondary | ICD-10-CM

## 2020-10-20 DIAGNOSIS — R102 Pelvic and perineal pain: Secondary | ICD-10-CM | POA: Diagnosis not present

## 2020-10-20 DIAGNOSIS — N390 Urinary tract infection, site not specified: Secondary | ICD-10-CM | POA: Diagnosis not present

## 2020-10-20 MED ORDER — ESTROGENS, CONJUGATED 0.625 MG/GM VA CREA: TOPICAL_CREAM | VAGINAL | 11 refills | Status: DC

## 2020-10-20 MED ORDER — ESTROGENS, CONJUGATED 0.625 MG/GM VA CREA: TOPICAL_CREAM | VAGINAL | 1 refills | Status: DC

## 2020-10-20 NOTE — Progress Notes (Signed)
   10/20/2020 12:59 PM   Avonelle Fredna Dow [birth year] [id number]  Reason for visit: Pelvic/vaginal pain, abdominal pain, recurrent UTIs  HPI: I met Ms. Lyn for the first time today in clinic for the above issues.  She was previously followed by Dr. Junious Silk.  Her primary complaints today are pain.  She has low back pain and lower abdominal pain when she stands for a long time and this really is her main complaint.  She was previously tried on 25 mg Myrbetriq by Dr. Junious Silk, and it does not sound like she had significant improvement on this medication, and has discontinued it.  She also reports yellow vaginal discharge and urethral discharge that is very bothersome to her.  She had an appointment with GYN, but this was canceled, and I encouraged her to re-schedule this.  She also had a cystoscopy previously with Dr. Junious Silk on 07/07/2020 which was benign, and I personally viewed and interpreted her CT scan with contrast dated 05/03/2020 that shows no abnormalities of the urologic system.  Most recent urine culture on 2/28 showed only mixed flora.  On pelvic exam today there is some mild erythema of the vaginal canal, but no urethral masses or lesions.  I do not appreciate any discharge.  D was chaperone.  Urinalysis today with 11-30 WBCs, 3-10 RBCs, few bacteria, nitrite negative, 2+ leukocytes.  Will send for culture.  I stressed to her that I am not sure her upper abdominal pain and back pain are related to the bladder or bladder infection.  We will call with culture results, and I think is reasonable to try a course of topical estrogen cream.  Reassurance was provided in terms of her recent negative urine culture, normal cystoscopy, normal CT scan.  Trial of topical estrogen cream RTC 6 weeks symptom check  Billey Co, MD  [facility] [address] [place], [place] [address] [phone]

## 2020-10-20 NOTE — Patient Instructions (Signed)
Pelvic Pain, Female Pelvic pain is pain in your lower abdomen, below your belly button and between your hips. The pain may start suddenly (be acute), keep coming back (be recurring), or last a long time (become chronic). Pelvic pain that lasts longer than 6 months is considered chronic. Pelvic pain may affect your:  Reproductive organs.  Urinary system.  Digestive tract.  Musculoskeletal system. There are many potential causes of pelvic pain. Sometimes, the pain can be a result of digestive or urinary conditions, strained muscles or ligaments, or reproductive conditions. Sometimes the cause of pelvic pain is not known. Follow these instructions at home:  Take over-the-counter and prescription medicines only as told by your health care provider.  Rest as told by your health care provider.  Do not have sex if it hurts.  Keep a journal of your pelvic pain. Write down: ? When the pain started. ? Where the pain is located. ? What seems to make the pain better or worse, such as food or your period (menstrual cycle). ? Any symptoms you have along with the pain.  Keep all follow-up visits as told by your health care provider. This is important.   Contact a health care provider if:  Medicine does not help your pain.  Your pain comes back.  You have new symptoms.  You have abnormal vaginal discharge or bleeding, including bleeding after menopause.  You have a fever or chills.  You are constipated.  You have blood in your urine or stool.  You have foul-smelling urine.  You feel weak or light-headed. Get help right away if:  You have sudden severe pain.  Your pain gets steadily worse.  You have severe pain along with fever, nausea, vomiting, or excessive sweating.  You lose consciousness. Summary  Pelvic pain is pain in your lower abdomen, below your belly button and between your hips.  There are many potential causes of pelvic pain.  Keep a journal of your pelvic  pain. This information is not intended to replace advice given to you by your health care provider. Make sure you discuss any questions you have with your health care provider. Document Revised: 01/16/2018 Document Reviewed: 01/16/2018 Elsevier Patient Education  [place].

## 2020-10-21 LAB — MICROSCOPIC EXAMINATION

## 2020-10-21 LAB — URINALYSIS, COMPLETE
Bilirubin, UA: NEGATIVE
Glucose, UA: NEGATIVE
Ketones, UA: NEGATIVE
Nitrite, UA: NEGATIVE
Protein,UA: NEGATIVE
Specific Gravity, UA: 1.015 (ref 1.005–1.030)
Urobilinogen, Ur: 0.2 mg/dL (ref 0.2–1.0)
pH, UA: 6 (ref 5.0–7.5)

## 2020-10-25 LAB — CULTURE, URINE COMPREHENSIVE

## 2020-10-26 LAB — MYCOPLASMA / UREAPLASMA CULTURE
Mycoplasma hominis Culture: NEGATIVE
Ureaplasma urealyticum: NEGATIVE

## 2020-10-27 ENCOUNTER — Telehealth: Payer: Self-pay

## 2020-10-27 DIAGNOSIS — B965 Pseudomonas (aeruginosa) (mallei) (pseudomallei) as the cause of diseases classified elsewhere: Secondary | ICD-10-CM

## 2020-10-27 MED ORDER — CIPROFLOXACIN HCL 500 MG PO TABS: 500.0000 mg | ORAL_TABLET | Freq: Two times a day (BID) | ORAL | 0 refills | Status: AC

## 2020-10-27 NOTE — Telephone Encounter (Signed)
Called pt, no answer. LM per DPR for pt and her son. RX sent to [facility]. Advised to call back for questions or concerns.

## 2020-10-27 NOTE — Telephone Encounter (Signed)
-----   Message from Billey Co, MD sent at 10/27/2020 10:22 AM EDT ----- Urine culture did grow bacteria.  This was resistant to most antibiotics, lets do Cipro 500 mg twice daily x10 days, hopefully this will help with her pelvic pain, thanks  Nickolas Madrid, MD 10/27/2020

## 2020-10-29 ENCOUNTER — Telehealth: Payer: Self-pay

## 2020-10-29 NOTE — Telephone Encounter (Signed)
Pt LVM on triage line stating that she is having lower abdominal pain, she states that she is unable to sleep at night due to pain. Pain is radiating down to her vagina. Pt states she cannot bend over. She questions if this is from her UTI.   Called pt, no answer. Left detailed message for pt informing her that lower abdominal pain is not typical of UTI especially given the length of time that she has been experiencing it (see documentation in care everywhere). Advised pt to call back to discuss further.

## 2020-12-08 ENCOUNTER — Encounter: Payer: Self-pay | Admitting: Urology

## 2020-12-08 ENCOUNTER — Ambulatory Visit: Payer: Medicare Other | Admitting: Urology

## 2020-12-08 ENCOUNTER — Other Ambulatory Visit: Payer: Self-pay

## 2020-12-08 VITALS — BP 128/70 | HR 90 | Ht 69.0 in | Wt 191.0 lb

## 2020-12-08 DIAGNOSIS — N390 Urinary tract infection, site not specified: Secondary | ICD-10-CM

## 2020-12-08 DIAGNOSIS — R102 Pelvic and perineal pain: Secondary | ICD-10-CM

## 2020-12-08 MED ORDER — AMITRIPTYLINE HCL 25 MG PO TABS: 25.0000 mg | ORAL_TABLET | Freq: Every day | ORAL | 3 refills | Status: DC

## 2020-12-08 NOTE — Patient Instructions (Signed)
Eating Plan for Interstitial Cystitis Interstitial cystitis (IC) is a long-term (chronic) condition that causes pain and pressure in the bladder, the lower abdomen, and the pelvic area. Other symptoms of IC include urinary urgency and frequency. Symptoms tend to come and go. Many people with IC find that certain foods trigger their symptoms. Different foods may be problematic for different people. Some foods are more likely to cause symptoms than others. Learning which foods bother you and which do not can help you come up with an eating plan to manage IC. What are tips for following this plan? You may find it helpful to work with a dietitian. This health care provider can help you develop your eating plan by doing an elimination diet, which involves these steps:  Start with a list of foods that you think trigger your IC symptoms along with the foods that most commonly trigger symptoms for many people with IC.  Eliminate those foods from your diet for about one month, then start reintroducing the foods one at a time to see which ones trigger your symptoms.  Make a list of the foods that trigger your symptoms. It may take several months to find out which foods bother you. Reading food labels Once you know which foods trigger your IC symptoms, you can avoid them. However, it is also a good idea to read food labels because some foods that trigger your symptoms may be included as ingredients in other foods. These ingredients may include:  Chili peppers.  Tomato products.  Soy.  Worcestershire sauce.  Vinegar.  Alcohol.  Citrus flavors or juices.  Artificial sweeteners.  Monosodium glutamate. Shopping  Shopping can be a challenge if many foods trigger your IC. When you go grocery shopping, bring a list of the foods you can eat.  You can get an app for your phone that lets you know which foods are the safest and which you may want to avoid. You can find the app at the Interstitial  Cystitis Network website: www.ic-network.com Meal planning  Plan your meals according to the results of your elimination diet. If you have not done an elimination diet, plan meals according to IC food lists recommended by your health care provider or dietitian. These lists tell you which foods are least and most likely to cause symptoms.  Avoid certain types of food when you go out to eat, such as pizza and foods typically served at Indian, Mexican, and Thai restaurants. These foods often contain ingredients that can aggravate IC. General information Here are some general guidelines for an IC eating plan:  Do not eat large portions.  Drink plenty of fluids with your meals.  Do not eat foods that are high in sugar, salt, or saturated fat.  Choose whole fruits instead of juice.  Eat a colorful variety of vegetables.   What foods should I eat? For people with IC, the best diet is a balanced one that includes things from all the food groups. Even if you have to avoid certain foods, there are still plenty of healthy choices in each group. The following are some foods that are least bothersome and may be safest to eat: Fruits Bananas. Blueberries and blueberry juice. Melons. Pears. Apples. Dates. Prunes. Raisins. Apricots. Vegetables Asparagus. Avocado. Celery. Beets. Bell peppers. Black olives. Broccoli. Brussels sprouts. Cabbage. Carrots. Cauliflower. Cucumber. Eggplant. Green beans. Potatoes. Radishes. Spinach. Squash. Turnips. Zucchini. Mushrooms. Peas. Grains Oats. Rice. Bran. Oatmeal. Whole wheat bread. Meats and other proteins Beef. Fish and other seafood.   Eggs. Nuts. Peanut butter. Pork. Poultry. Lamb. Garbanzo beans. Pinto beans. Dairy Whole or low-fat milk. American, mozzarella, mild cheddar, feta, ricotta, and cream cheeses. The items listed above may not be a complete list of foods and beverages you can eat. Contact a dietitian for more information. What foods should I  avoid? You should avoid any foods that seem to trigger your symptoms. It is also a good idea to avoid foods that are most likely to cause symptoms in many people with IC. These include the following: Fruits Citrus fruits, including lemons, limes, oranges, and grapefruit. Cranberries. Strawberries. Pineapple. Kiwi. Vegetables Chili peppers. Onions. Sauerkraut. Tomato and tomato products. Pickles. Grains You do not need to avoid any type of grain unless it triggers your symptoms. Meats and other proteins Precooked or cured meats, such as sausages or meat loaves. Soy products. Dairy Chocolate ice cream. Processed cheese. Yogurt. Beverages Alcohol. Chocolate drinks. Coffee. Cranberry juice. Carbonated drinks. Tea (black, green, or herbal). Tomato juice. Sports drinks. The items listed above may not be a complete list of foods and beverages you should avoid. Contact a dietitian for more information. Summary  Many people with IC find that certain foods trigger their symptoms. Different foods may be problematic for different people. Some foods are more likely to cause symptoms than others.  You may find it helpful to work with a dietitian to do an elimination diet and come up with an eating plan that is right for you.  Plan your meals according to the results of your elimination diet. If you have not done an elimination diet, plan your meals using IC food lists. These lists tell you which foods are least and most likely to cause symptoms.  The best diet for people with IC is a balanced diet that includes foods from all the food groups. Even if you have to avoid certain foods, there are still plenty of healthy choices in each group. This information is not intended to replace advice given to you by your health care provider. Make sure you discuss any questions you have with your health care provider. Document Revised: 11/21/2018 Document Reviewed: 04/04/2018 Elsevier Patient Education  2021  [employer]vier Inc.  

## 2020-12-08 NOTE — Progress Notes (Signed)
   12/08/2020 11:38 AM   [place] Jul 15, 1941 [id number]  Reason for visit: Follow up pelvic pain, abdominal pain, back pain, recurrent UTI, dysuria  HPI: I saw Ms. Barbato for follow-up of the above issues.  She was previously followed by Dr. Junious Silk, and I met her for the first time in March 2022.  She has a very long history of chronic low back and abdominal pain of unclear etiology.  CT scan was benign in September 2021, and cystoscopy with Dr. Junious Silk was benign in November 2021.  She has had UTIs in the past, but last 2 cultures have showed no growth.  Ureaplasma and mycoplasma cultures were negative as well in March.  Pelvic exam in March showed mild erythema of the vaginal canal but no masses or lesions, no discharge.  At our last visit I recommended a trial of Premarin cream, but she did not feel comfortable starting this.  She has followed with both GYN and GI for her chronic pelvic and abdominal pain as well.  She has an upcoming endoscopy for her further evaluation of her GERD.  We discussed the complexities of pelvic pain and possible range of etiologies including pelvic floor dysfunction, chronic bladder pain syndrome, and interstitial cystitis.  We reviewed the AUA guidelines that recommend an algorithmic approach to treatment for these patients, and that a trial of different medications and strategies is sometimes needed to find the approach that works best for each patient's unique situation.  I reinforced the importance of stress management, relaxation, avoiding triggers, and pain management in the approach to pelvic pain.  -I recommended a trial of amitriptyline 25 mg daily for her pain symptoms, she has a long history of problems with her bowels as well with an IBS type picture, and this may improve the symptoms simultaneously -RTC 3 months after endoscopy with GI for symptom check   Billey Co, MD  [place] [address],  [place], [place] [address] [phone]

## 2020-12-09 ENCOUNTER — Telehealth: Payer: Self-pay | Admitting: *Deleted

## 2020-12-09 NOTE — Telephone Encounter (Signed)
Patient called in this morning states she is not going to try the Amitriptyline because the side effects is weight gain . She would like to c/x next appt also

## 2020-12-31 ENCOUNTER — Encounter: Payer: Self-pay | Admitting: *Deleted

## 2021-01-03 ENCOUNTER — Ambulatory Visit: Payer: Medicare Other | Admitting: Anesthesiology

## 2021-01-03 ENCOUNTER — Ambulatory Visit
Admission: RE | Admit: 2021-01-03 | Discharge: 2021-01-03 | Disposition: A | Discharge status: Home / Self Care | Payer: Medicare Other | Attending: Gastroenterology | Admitting: Gastroenterology

## 2021-01-03 ENCOUNTER — Encounter
Admission: RE | Disposition: A | Discharge status: Home / Self Care | Payer: Self-pay | Source: Home / Self Care | Attending: Gastroenterology

## 2021-01-03 DIAGNOSIS — E119 Type 2 diabetes mellitus without complications: Secondary | ICD-10-CM | POA: Diagnosis not present

## 2021-01-03 DIAGNOSIS — K449 Diaphragmatic hernia without obstruction or gangrene: Secondary | ICD-10-CM | POA: Insufficient documentation

## 2021-01-03 DIAGNOSIS — K5900 Constipation, unspecified: Secondary | ICD-10-CM | POA: Diagnosis not present

## 2021-01-03 DIAGNOSIS — Z888 Allergy status to other drugs, medicaments and biological substances status: Secondary | ICD-10-CM | POA: Insufficient documentation

## 2021-01-03 DIAGNOSIS — Z8719 Personal history of other diseases of the digestive system: Secondary | ICD-10-CM | POA: Insufficient documentation

## 2021-01-03 DIAGNOSIS — K640 First degree hemorrhoids: Secondary | ICD-10-CM | POA: Diagnosis not present

## 2021-01-03 DIAGNOSIS — R1013 Epigastric pain: Secondary | ICD-10-CM | POA: Insufficient documentation

## 2021-01-03 DIAGNOSIS — Z7984 Long term (current) use of oral hypoglycemic drugs: Secondary | ICD-10-CM | POA: Insufficient documentation

## 2021-01-03 DIAGNOSIS — Z7982 Long term (current) use of aspirin: Secondary | ICD-10-CM | POA: Diagnosis not present

## 2021-01-03 DIAGNOSIS — Z800 Family history of malignant neoplasm of digestive organs: Secondary | ICD-10-CM | POA: Insufficient documentation

## 2021-01-03 DIAGNOSIS — K6289 Other specified diseases of anus and rectum: Secondary | ICD-10-CM | POA: Diagnosis not present

## 2021-01-03 DIAGNOSIS — K5730 Diverticulosis of large intestine without perforation or abscess without bleeding: Secondary | ICD-10-CM | POA: Insufficient documentation

## 2021-01-03 DIAGNOSIS — Z8049 Family history of malignant neoplasm of other genital organs: Secondary | ICD-10-CM | POA: Diagnosis not present

## 2021-01-03 DIAGNOSIS — Z79899 Other long term (current) drug therapy: Secondary | ICD-10-CM | POA: Diagnosis not present

## 2021-01-03 DIAGNOSIS — K3189 Other diseases of stomach and duodenum: Secondary | ICD-10-CM | POA: Diagnosis not present

## 2021-01-03 HISTORY — PX: COLONOSCOPY WITH PROPOFOL: SHX5780

## 2021-01-03 HISTORY — PX: ESOPHAGOGASTRODUODENOSCOPY (EGD) WITH PROPOFOL: SHX5813

## 2021-01-03 LAB — GLUCOSE, CAPILLARY: Glucose-Capillary: 101 mg/dL — ABNORMAL HIGH (ref 70–99)

## 2021-01-03 SURGERY — ESOPHAGOGASTRODUODENOSCOPY (EGD) WITH PROPOFOL
Anesthesia: General

## 2021-01-03 MED ORDER — SODIUM CHLORIDE 0.9 % IV SOLN
INTRAVENOUS | Status: DC
  Administered 2021-01-03: 1000 mL via INTRAVENOUS

## 2021-01-03 MED ORDER — PROPOFOL 10 MG/ML IV BOLUS
INTRAVENOUS | Status: DC | PRN
  Administered 2021-01-03: 60 mg via INTRAVENOUS

## 2021-01-03 MED ORDER — LIDOCAINE HCL (CARDIAC) PF 100 MG/5ML IV SOSY
PREFILLED_SYRINGE | INTRAVENOUS | Status: DC | PRN
  Administered 2021-01-03: 40 mg via INTRAVENOUS

## 2021-01-03 MED ORDER — PROPOFOL 500 MG/50ML IV EMUL
INTRAVENOUS | Status: AC
  Filled 2021-01-03: qty 50

## 2021-01-03 MED ORDER — PROPOFOL 500 MG/50ML IV EMUL
INTRAVENOUS | Status: DC | PRN
  Administered 2021-01-03: 150 ug/kg/min via INTRAVENOUS

## 2021-01-03 NOTE — Transfer of Care (Signed)
Immediate Anesthesia Transfer of Care Note  Patient: Boots Mcglown  Procedure(s) Performed: ESOPHAGOGASTRODUODENOSCOPY (EGD) WITH PROPOFOL (N/A ) COLONOSCOPY WITH PROPOFOL (N/A )  Patient Location: PACU  Anesthesia Type:General  Level of Consciousness: sedated  Airway & Oxygen Therapy: Patient Spontanous Breathing and Patient connected to face mask oxygen  Post-op Assessment: Report given to RN and Post -op Vital signs reviewed and stable  Post vital signs: Reviewed and stable  Last Vitals:  Vitals Value Taken Time  BP    Temp    Pulse 69 01/03/21 1302  Resp 18 01/03/21 1302  SpO2 95 % 01/03/21 1302  Vitals shown include unvalidated device data.  Last Pain:  Vitals:   01/03/21 1158  TempSrc: Temporal  PainSc: 0-No pain         Complications: No complications documented.

## 2021-01-03 NOTE — Op Note (Signed)
[facility] Gastroenterology Patient Name: Vicki Norris Procedure Date: 01/03/2021 12:22 PM MRN: [id number] Account #: [id number] Date of Birth: February 12, 1941 Admit Type: Outpatient Age: 80 Room: [facility] ENDO ROOM 3 Gender: Female Note Status: Finalized Procedure:             Colonoscopy Indications:           Constipation, Rectal pain Providers:             Andrey Farmer MD, MD Referring MD:          Sofie Hartigan (Referring MD) Medicines:             Monitored Anesthesia Care Complications:         No immediate complications. Procedure:             Pre-Anesthesia Assessment:                        - Prior to the procedure, a History and Physical was                         performed, and patient medications and allergies were                         reviewed. The patient is competent. The risks and                         benefits of the procedure and the sedation options and                         risks were discussed with the patient. All questions                         were answered and informed consent was obtained.                         Patient identification and proposed procedure were                         verified by the physician, the nurse, the anesthetist                         and the technician in the endoscopy suite. Mental                         Status Examination: alert and oriented. Airway                         Examination: normal oropharyngeal airway and neck                         mobility. Respiratory Examination: clear to                         auscultation. CV Examination: normal. Prophylactic                         Antibiotics: The patient does not require prophylactic  antibiotics. Prior Anticoagulants: The patient has                         taken no previous anticoagulant or antiplatelet                         agents. ASA Grade Assessment: II - A patient with mild                          systemic disease. After reviewing the risks and                         benefits, the patient was deemed in satisfactory                         condition to undergo the procedure. The anesthesia                         plan was to use monitored anesthesia care (MAC).                         Immediately prior to administration of medications,                         the patient was re-assessed for adequacy to receive                         sedatives. The heart rate, respiratory rate, oxygen                         saturations, blood pressure, adequacy of pulmonary                         ventilation, and response to care were monitored                         throughout the procedure. The physical status of the                         patient was re-assessed after the procedure.                        After obtaining informed consent, the colonoscope was                         passed under direct vision. Throughout the procedure,                         the patient's blood pressure, pulse, and oxygen                         saturations were monitored continuously. The                         Colonoscope was introduced through the anus and                         advanced to the the terminal ileum. The colonoscopy  was performed without difficulty. The patient                         tolerated the procedure well. The quality of the bowel                         preparation was good. Findings:      The perianal and digital rectal examinations were normal.      The terminal ileum appeared normal.      Many small-mouthed diverticula were found in the sigmoid colon.      Internal hemorrhoids were found during retroflexion. The hemorrhoids       were Grade I (internal hemorrhoids that do not prolapse).      The exam was otherwise without abnormality on direct and retroflexion       views. Impression:            - The examined portion of the ileum was normal.                         - Diverticulosis in the sigmoid colon.                        - Internal hemorrhoids.                        - The examination was otherwise normal on direct and                         retroflexion views.                        - No specimens collected. Recommendation:        - Discharge patient to home.                        - Resume previous diet.                        - Continue present medications.                        - Repeat colonoscopy is not recommended due to current                         age (32 years or older) for surveillance.                        - Return to referring physician as previously                         scheduled. Procedure Code(s):     --- Professional ---                        [id number], Colonoscopy, flexible; diagnostic, including                         collection of specimen(s) by brushing or washing, when                         performed (separate procedure) Diagnosis Code(s):     ---  Professional ---                        K64.0, First degree hemorrhoids                        K59.00, Constipation, unspecified                        K62.89, Other specified diseases of anus and rectum                        K57.30, Diverticulosis of large intestine without                         perforation or abscess without bleeding CPT copyright 2019 American Medical Association. All rights reserved. The codes documented in this report are preliminary and upon coder review may  be revised to meet current compliance requirements. Andrey Farmer MD, MD 01/03/2021 1:04:59 PM Number of Addenda: 0 Note Initiated On: 01/03/2021 12:22 PM Scope Withdrawal Time: 0 hours 11 minutes 30 seconds  Total Procedure Duration: 0 hours 20 minutes 17 seconds  Estimated Blood Loss:  Estimated blood loss: none.      [facility]

## 2021-01-03 NOTE — Anesthesia Postprocedure Evaluation (Signed)
Anesthesia Post Note  Patient: Vicki Norris  Procedure(s) Performed: ESOPHAGOGASTRODUODENOSCOPY (EGD) WITH PROPOFOL (N/A ) COLONOSCOPY WITH PROPOFOL (N/A )  Patient location during evaluation: Phase II Anesthesia Type: General Level of consciousness: awake and alert, awake and oriented Pain management: pain level controlled Vital Signs Assessment: post-procedure vital signs reviewed and stable Respiratory status: spontaneous breathing, nonlabored ventilation and respiratory function stable Cardiovascular status: blood pressure returned to baseline and stable Postop Assessment: no apparent nausea or vomiting Anesthetic complications: no   No complications documented.   Last Vitals:  Vitals:   01/03/21 1322 01/03/21 1332  BP: (!) 145/66 (!) 155/70  Pulse:    Resp:    Temp:    SpO2:      Last Pain:  Vitals:   01/03/21 1332  TempSrc:   PainSc: 0-No pain                 Phill Mutter

## 2021-01-03 NOTE — H&P (Signed)
Outpatient short stay form Pre-procedure 01/03/2021 12:26 PM Vicki Miyamoto MD, MPH  Primary Physician: Dr. Ellison Norris  Reason for visit:  Epigastric pain/rectal pain  History of present illness:   80 y/o lady with history of diabetes here for EGD/Colonoscopy for epigastric pain and rectal pain. Had unremarkable EGD/Colonoscopy in 2019. Feels like she has pressure in her rectal area. Had diverticulitis in September of last year. No blood thinners. Had hysterectomy. Family history of pancreatic cancer in her brother and uterine cancer in her sister.    Current Facility-Administered Medications:  .  0.9 %  sodium chloride infusion, , Intravenous, Continuous, Vicki Norris, [name], MD, Last Rate: 20 mL/hr at 01/03/21 1221, 1,000 mL at 01/03/21 1221  Medications Prior to Admission  Medication Sig Dispense Refill Last Dose  . ACCU-CHEK AVIVA PLUS test strip 1 each 3 (three) times daily.     Vicki Norris amitriptyline (ELAVIL) 25 MG tablet Take 1 tablet (25 mg total) by mouth at bedtime. 30 tablet 3   . ascorbic acid (VITAMIN C) 250 MG CHEW Chew 250 mg by mouth daily.     Vicki Norris aspirin 81 MG tablet Take 81 mg by mouth daily.   01/01/2021  . azelastine (ASTELIN) 0.1 % nasal spray Place into the nose.     . cetirizine-pseudoephedrine (ZYRTEC-D) 5-120 MG tablet Take 1 tablet by mouth daily. 30 tablet 0   . cholecalciferol (VITAMIN D) 1000 units tablet Take 1,000 Units by mouth 2 (two) times daily.      . clotrimazole-betamethasone (LOTRISONE) cream APPLY TO AFFECTED AREA TWICE A DAY     . conjugated estrogens (PREMARIN) vaginal cream Estrogen Cream Instruction Discard applicator Apply pea sized amount to tip of finger to urethra before bed. Wash hands well after application. Use Monday, Wednesday and Friday (Patient not taking: Reported on 12/08/2020) 42.5 g 11   . cyanocobalamin 1000 MCG tablet Take 1,000 mcg by mouth daily.     Vicki Norris glipiZIDE (GLUCOTROL XL) 5 MG 24 hr tablet Take 5 mg by mouth daily.     .  hydrocortisone (ANUSOL-HC) 2.5 % rectal cream Apply topically.     . hydrocortisone (ANUSOL-HC) 25 MG suppository INSERT THIS VAGINALLY DAILY X 1 WEEK. THEN CONTINUE TWICE WEEKLY X 8 WEEKS     . lansoprazole (PREVACID) 30 MG capsule Take 30 mg by mouth daily.     . Milk Thistle Extract 175 MG TABS Take by mouth.     . Multiple Vitamins-Minerals (MULTIVITAMIN WITH MINERALS) tablet Take 1 tablet by mouth daily.     . Omega-3 Fatty Acids (FISH OIL PO) Take 300 mg by mouth.     . ondansetron (ZOFRAN) 4 MG tablet Take 4 mg by mouth every 8 (eight) hours as needed.     . phenazopyridine (PYRIDIUM) 200 MG tablet Take 200 mg by mouth 3 (three) times daily.     . RABEprazole (ACIPHEX) 20 MG tablet Take 20 mg by mouth daily.     . rosuvastatin (CRESTOR) 5 MG tablet Take 5 mg by mouth daily.     . traZODone (DESYREL) 50 MG tablet Take 50 mg by mouth at bedtime.     . Zinc Oxide 25 % PSTE Apply topically.        Allergies  Allergen Reactions  . Citalopram Other (See Comments)    tremors  . Simvastatin Other (See Comments)    myalgias  . Metformin And Related     Constipation,diarrhea  . Pantoprazole Swelling  . Prozac [Fluoxetine]  Past Medical History:  Diagnosis Date  . Anemia   . Depression   . Diabetes mellitus without complication ([place])   . GERD (gastroesophageal reflux disease)   . Head ache   . Hypertension   . Osteoarthritis   . Stroke [facility])    TIA  . TIA (transient ischemic attack)     Review of systems:  Otherwise negative.    Physical Exam  Gen: Alert, oriented. Appears stated age.  HEENT: PERRLA. Lungs: No respiratory distress CV: RRR Abd: soft, benign, no masses Ext: No edema    Planned procedures: Proceed with EGD/colonoscopy. The patient understands the nature of the planned procedure, indications, risks, alternatives and potential complications including but not limited to bleeding, infection, perforation, damage to internal organs and possible  oversedation/side effects from anesthesia. The patient agrees and gives consent to proceed.  Please refer to procedure notes for findings, recommendations and patient disposition/instructions.     Vicki Miyamoto MD, MPH Gastroenterology 01/03/2021  12:26 PM

## 2021-01-03 NOTE — Anesthesia Preprocedure Evaluation (Addendum)
Anesthesia Evaluation  Patient identified by MRN, date of birth, ID band Patient awake    Reviewed: Allergy & Precautions, H&P , NPO status , Patient's Chart, lab work & pertinent test results, reviewed documented beta blocker date and time   History of Anesthesia Complications Negative for: history of anesthetic complications  Airway Mallampati: II  TM Distance: <3 FB Neck ROM: limited    Dental  (+) Upper Dentures, Missing, Chipped   Pulmonary neg pulmonary ROS, neg shortness of breath,    Pulmonary exam normal        Cardiovascular Exercise Tolerance: Good hypertension, (-) angina(-) DOE Normal cardiovascular exam     Neuro/Psych  Headaches, PSYCHIATRIC DISORDERS Depression TIACVA, No Residual Symptoms negative psych ROS   GI/Hepatic Neg liver ROS, GERD  Controlled and Medicated,  Endo/Other  diabetes, Type 2  Renal/GU negative Renal ROS  negative genitourinary   Musculoskeletal  (+) Arthritis , Osteoarthritis,    Abdominal   Peds  Hematology negative hematology ROS (+) Blood dyscrasia, anemia ,   Anesthesia Other Findings Depression GERD (gastroesophageal reflux disease) Headache    Hypercholesterolemia  New onset type 2 diabetes mellitus (HCC)  Other constipation  Primary osteoarthritis of right knee  Recurrent major depressive disorder (HCC)    Reproductive/Obstetrics negative OB ROS                             Anesthesia Physical  Anesthesia Plan  ASA: III  Anesthesia Plan: General   Post-op Pain Management:    Induction: Intravenous  PONV Risk Score and Plan: Propofol infusion and TIVA  Airway Management Planned: Natural Airway and Nasal Cannula  Additional Equipment:   Intra-op Plan:   Post-operative Plan:   Informed Consent: I have reviewed the patients History and Physical, chart, labs and discussed the procedure including the risks, benefits and  alternatives for the proposed anesthesia with the patient or authorized representative who has indicated his/her understanding and acceptance.     Dental Advisory Given  Plan Discussed with: CRNA  Anesthesia Plan Comments: (Patient consented for risks of anesthesia including but not limited to:  - adverse reactions to medications - risk of airway placement if required - damage to eyes, teeth, lips or other oral mucosa - nerve damage due to positioning  - sore throat or hoarseness - Damage to heart, brain, nerves, lungs, other parts of body or loss of life  Patient voiced understanding.)        Anesthesia Quick Evaluation

## 2021-01-03 NOTE — Interval H&P Note (Signed)
History and Physical Interval Note:  01/03/2021 12:29 PM  Vicki Norris  has presented today for surgery, with the diagnosis of Epigastric pain, GERD.  The various methods of treatment have been discussed with the patient and family. After consideration of risks, benefits and other options for treatment, the patient has consented to  Procedure(s): ESOPHAGOGASTRODUODENOSCOPY (EGD) WITH PROPOFOL (N/A) COLONOSCOPY WITH PROPOFOL (N/A) as a surgical intervention.  The patient's history has been reviewed, patient examined, no change in status, stable for surgery.  I have reviewed the patient's chart and labs.  Questions were answered to the patient's satisfaction.     Lesly Rubenstein  Ok to proceed with EGD/Colonoscopy

## 2021-01-03 NOTE — Op Note (Signed)
[facility] Gastroenterology Patient Name: Vicki Norris Procedure Date: 01/03/2021 12:23 PM MRN: [id number] Account #: [id number] Date of Birth: 1941-01-20 Admit Type: Outpatient Age: 80 Room: [facility] ENDO ROOM 3 Gender: Female Note Status: Finalized Procedure:             Upper GI endoscopy Indications:           Epigastric abdominal pain Providers:             Andrey Farmer MD, MD Referring MD:          Sofie Hartigan (Referring MD) Medicines:             Monitored Anesthesia Care Complications:         No immediate complications. Estimated blood loss:                         Minimal. Procedure:             Pre-Anesthesia Assessment:                        - Prior to the procedure, a History and Physical was                         performed, and patient medications and allergies were                         reviewed. The patient is competent. The risks and                         benefits of the procedure and the sedation options and                         risks were discussed with the patient. All questions                         were answered and informed consent was obtained.                         Patient identification and proposed procedure were                         verified by the physician, the nurse, the anesthetist                         and the technician in the endoscopy suite. Mental                         Status Examination: alert and oriented. Airway                         Examination: normal oropharyngeal airway and neck                         mobility. Respiratory Examination: clear to                         auscultation. CV Examination: normal. Prophylactic  Antibiotics: The patient does not require prophylactic                         antibiotics. Prior Anticoagulants: The patient has                         taken no previous anticoagulant or antiplatelet                         agents. ASA Grade  Assessment: II - A patient with mild                         systemic disease. After reviewing the risks and                         benefits, the patient was deemed in satisfactory                         condition to undergo the procedure. The anesthesia                         plan was to use monitored anesthesia care (MAC).                         Immediately prior to administration of medications,                         the patient was re-assessed for adequacy to receive                         sedatives. The heart rate, respiratory rate, oxygen                         saturations, blood pressure, adequacy of pulmonary                         ventilation, and response to care were monitored                         throughout the procedure. The physical status of the                         patient was re-assessed after the procedure.                        After obtaining informed consent, the endoscope was                         passed under direct vision. Throughout the procedure,                         the patient's blood pressure, pulse, and oxygen                         saturations were monitored continuously. The Endoscope                         was introduced through the mouth, and advanced to the  second part of duodenum. The upper GI endoscopy was                         somewhat difficult due to the patient's oxygen                         desaturation. Successful completion of the procedure                         was aided by managing the patient's medical                         instability. The patient tolerated the procedure well. Findings:      A small hiatal hernia was present.      The exam of the esophagus was otherwise normal.      A single 4 mm papule (nodule) with no stigmata of recent bleeding was       found in the gastric antrum. Biopsies were taken with a cold forceps for       histology. Estimated blood loss was minimal.      The  examined duodenum was normal. Impression:            - Small hiatal hernia.                        - A single papule (nodule) found in the stomach.                         Biopsied.                        - Normal examined duodenum. Recommendation:        - Perform a colonoscopy today. Procedure Code(s):     --- Professional ---                        [phone], Esophagogastroduodenoscopy, flexible,                         transoral; with biopsy, single or multiple Diagnosis Code(s):     --- Professional ---                        K44.9, Diaphragmatic hernia without obstruction or                         gangrene                        K31.89, Other diseases of stomach and duodenum                        R10.13, Epigastric pain CPT copyright 2019 American Medical Association. All rights reserved. The codes documented in this report are preliminary and upon coder review may  be revised to meet current compliance requirements. Andrey Farmer MD, MD 01/03/2021 1:02:22 PM Number of Addenda: 0 Note Initiated On: 01/03/2021 12:23 PM Estimated Blood Loss:  Estimated blood loss was minimal.      [facility]

## 2021-01-04 ENCOUNTER — Encounter: Payer: Self-pay | Admitting: Gastroenterology

## 2021-01-04 LAB — SURGICAL PATHOLOGY

## 2021-02-10 ENCOUNTER — Other Ambulatory Visit: Payer: Self-pay | Admitting: Family Medicine

## 2021-03-10 ENCOUNTER — Ambulatory Visit: Payer: Self-pay | Admitting: Urology

## 2021-04-14 DIAGNOSIS — U071 COVID-19: Secondary | ICD-10-CM | POA: Insufficient documentation

## 2021-04-14 HISTORY — DX: COVID-19: U07.1

## 2021-05-26 ENCOUNTER — Other Ambulatory Visit: Payer: Self-pay

## 2021-05-26 ENCOUNTER — Ambulatory Visit (INDEPENDENT_AMBULATORY_CARE_PROVIDER_SITE_OTHER): Payer: Medicare Other

## 2021-05-26 ENCOUNTER — Encounter: Payer: Self-pay | Admitting: Emergency Medicine

## 2021-05-26 ENCOUNTER — Ambulatory Visit
Admission: EM | Admit: 2021-05-26 | Discharge: 2021-05-26 | Disposition: A | Discharge status: Home / Self Care | Payer: Medicare Other | Attending: Emergency Medicine | Admitting: Emergency Medicine

## 2021-05-26 DIAGNOSIS — R109 Unspecified abdominal pain: Secondary | ICD-10-CM | POA: Diagnosis present

## 2021-05-26 DIAGNOSIS — R1032 Left lower quadrant pain: Secondary | ICD-10-CM | POA: Insufficient documentation

## 2021-05-26 DIAGNOSIS — K5900 Constipation, unspecified: Secondary | ICD-10-CM | POA: Diagnosis present

## 2021-05-26 LAB — URINALYSIS, COMPLETE (UACMP) WITH MICROSCOPIC
Bilirubin Urine: NEGATIVE
Glucose, UA: NEGATIVE mg/dL
Ketones, ur: NEGATIVE mg/dL
Nitrite: NEGATIVE
Protein, ur: NEGATIVE mg/dL
Specific Gravity, Urine: 1.015 (ref 1.005–1.030)
pH: 6.5 (ref 5.0–8.0)

## 2021-05-26 LAB — COMPREHENSIVE METABOLIC PANEL
ALT: 21 U/L (ref 0–44)
AST: 18 U/L (ref 15–41)
Albumin: 4.6 g/dL (ref 3.5–5.0)
Alkaline Phosphatase: 71 U/L (ref 38–126)
Anion gap: 7 (ref 5–15)
BUN: 18 mg/dL (ref 8–23)
CO2: 28 mmol/L (ref 22–32)
Calcium: 9.3 mg/dL (ref 8.9–10.3)
Chloride: 104 mmol/L (ref 98–111)
Creatinine, Ser: 0.95 mg/dL (ref 0.44–1.00)
GFR, Estimated: >60 mL/min (ref >60)
Glucose, Bld: 146 mg/dL — ABNORMAL HIGH (ref 70–99)
Potassium: 3.9 mmol/L (ref 3.5–5.1)
Sodium: 139 mmol/L (ref 135–145)
Total Bilirubin: 0.6 mg/dL (ref 0.3–1.2)
Total Protein: 8.1 g/dL (ref 6.5–8.1)

## 2021-05-26 LAB — CBC WITH DIFFERENTIAL/PLATELET
Abs Immature Granulocytes: 0.01 10*3/uL (ref 0.00–0.07)
Basophils Absolute: 0 10*3/uL (ref 0.0–0.1)
Basophils Relative: 1 %
Eosinophils Absolute: 0.1 10*3/uL (ref 0.0–0.5)
Eosinophils Relative: 1 %
HCT: 37.5 % (ref 36.0–46.0)
Hemoglobin: 12.6 g/dL (ref 12.0–15.0)
Immature Granulocytes: 0 %
Lymphocytes Relative: 33 %
Lymphs Abs: 1.6 10*3/uL (ref 0.7–4.0)
MCH: 30.6 pg (ref 26.0–34.0)
MCHC: 33.6 g/dL (ref 30.0–36.0)
MCV: 91 fL (ref 80.0–100.0)
Monocytes Absolute: 0.5 10*3/uL (ref 0.1–1.0)
Monocytes Relative: 10 %
Neutro Abs: 2.7 10*3/uL (ref 1.7–7.7)
Neutrophils Relative %: 55 %
Platelets: 305 10*3/uL (ref 150–400)
RBC: 4.12 MIL/uL (ref 3.87–5.11)
RDW: 13.5 % (ref 11.5–15.5)
WBC: 4.9 10*3/uL (ref 4.0–10.5)
nRBC: 0 % (ref 0.0–0.2)

## 2021-05-26 LAB — WET PREP, GENITAL
Clue Cells Wet Prep HPF POC: NONE SEEN
Sperm: NONE SEEN
Trich, Wet Prep: NONE SEEN
Yeast Wet Prep HPF POC: NONE SEEN

## 2021-05-26 NOTE — Discharge Instructions (Addendum)
Your blood work and urinalysis did not show any signs of infection.  Your x-rays did show a large volume of stool throughout your colon causing constipation which may be what is causing some of your flank pain and left lower quadrant abdominal pain.  Take MiraLAX daily, 17 g (1 capful) in 8 ounces of a beverage of your choice, daily until you achieve regular bowel movements.  The green-yellow discharge that you are experiencing is of an unknown source.  You had the same symptoms when you saw [facility] urgent care in August and they referred you to [facility].  I recommend calling [facility] at their [place] location to establish an appointment for further evaluation.  If you develop any increasing abdominal pain, nausea, vomiting, or fever please go to the emergency room for evaluation.

## 2021-05-26 NOTE — ED Triage Notes (Signed)
Pt presents today with c/o of left flank pain that began last Saturday. She c/o nausea; denies v/d. Denies fever.

## 2021-05-26 NOTE — ED Provider Notes (Signed)
[facility]    CSN: [id number] Arrival date & time: 05/26/21  3716      History   Chief Complaint Chief Complaint  Patient presents with   Flank Pain    Left     HPI Vicki Norris is a 80 y.o. female.   HPI  80 year old female here for evaluation of left flank pain.  Patient reports that she has been experiencing pain in her left flank for the past 5 days.  This has been associated with nausea, chills, painful urination, urinary urgency and frequency.  She states that when she was at the rodeo the day her flank pain started she felt a drainage from her vaginal area.  She states that when she got home she had a yellow-green mucus and it was sticky.  She states that for last week or so when she wipes after urination she is had a yellow discharge as well.  She denies any fever or blood in her urine.  She also denies sweating.  Patient was evaluated at [facility] urgent care on 04/11/2021 for similar symptoms and referred to OB/GYN.  Past Medical History:  Diagnosis Date   Anemia    COVID-19 04/2021   Depression    Diabetes mellitus without complication (HCC)    GERD (gastroesophageal reflux disease)    Head ache    Hypertension    Osteoarthritis    Stroke [facility])    TIA   TIA (transient ischemic attack)     Patient Active Problem List   Diagnosis Date Noted   COVID-19 04/2021   Acute pain of right knee 01/22/2018   Osteopenia 09/11/2017   Presbyesophagus 09/11/2017   Primary osteoarthritis of right knee 10/26/2016   Other constipation 06/19/2016   Hypercholesterolemia 06/08/2016   New onset type 2 diabetes mellitus ([place]) 06/08/2016   Vitamin B 12 deficiency 06/08/2016   Recurrent major depressive disorder ([place]) 04/14/2016   Headache 09/20/2015   Depression 01/07/2014   GERD (gastroesophageal reflux disease) 01/07/2014    Past Surgical History:  Procedure Laterality Date   ABDOMINAL HYSTERECTOMY     CATARACT EXTRACTION Bilateral    COLONOSCOPY      COLONOSCOPY WITH PROPOFOL N/A 11/01/2017   Procedure: COLONOSCOPY WITH PROPOFOL;  Surgeon: Lollie Sails, MD;  Location: [facility] ENDOSCOPY;  Service: Endoscopy;  Laterality: N/A;   COLONOSCOPY WITH PROPOFOL N/A 01/03/2021   Procedure: COLONOSCOPY WITH PROPOFOL;  Surgeon: Lesly Rubenstein, MD;  Location: [facility] ENDOSCOPY;  Service: Endoscopy;  Laterality: N/A;   ESOPHAGOGASTRODUODENOSCOPY     ESOPHAGOGASTRODUODENOSCOPY (EGD) WITH PROPOFOL N/A 06/29/2016   Procedure: ESOPHAGOGASTRODUODENOSCOPY (EGD) WITH PROPOFOL;  Surgeon: Lollie Sails, MD;  Location: [facility] ENDOSCOPY;  Service: Endoscopy;  Laterality: N/A;   ESOPHAGOGASTRODUODENOSCOPY (EGD) WITH PROPOFOL N/A 11/01/2017   Procedure: ESOPHAGOGASTRODUODENOSCOPY (EGD) WITH PROPOFOL;  Surgeon: Lollie Sails, MD;  Location: [facility] ENDOSCOPY;  Service: Endoscopy;  Laterality: N/A;   ESOPHAGOGASTRODUODENOSCOPY (EGD) WITH PROPOFOL N/A 01/03/2021   Procedure: ESOPHAGOGASTRODUODENOSCOPY (EGD) WITH PROPOFOL;  Surgeon: Lesly Rubenstein, MD;  Location: [facility] ENDOSCOPY;  Service: Endoscopy;  Laterality: N/A;   ROTATOR CUFF REPAIR     TUBAL LIGATION      OB History   No obstetric history on file.      Home Medications    Prior to Admission medications   Medication Sig Start Date End Date Taking? Authorizing Provider  ACCU-CHEK AVIVA PLUS test strip 1 each 3 (three) times daily. 10/24/20   [provider]  amitriptyline (ELAVIL) 25 MG  tablet Take 1 tablet (25 mg total) by mouth at bedtime. 12/08/20   Billey Co, MD  ascorbic acid (VITAMIN C) 250 MG CHEW Chew 250 mg by mouth daily.    [provider]  aspirin 81 MG tablet Take 81 mg by mouth daily.    [provider]  azelastine (ASTELIN) 0.1 % nasal spray Place into the nose. 11/08/20 11/08/21  [provider]  cetirizine-pseudoephedrine (ZYRTEC-D) 5-120 MG tablet Take 1 tablet by mouth daily. 09/06/19   Karen Kitchens, NP  cholecalciferol (VITAMIN D) 1000  units tablet Take 1,000 Units by mouth 2 (two) times daily.     [provider]  clotrimazole-betamethasone (LOTRISONE) cream APPLY TO AFFECTED AREA TWICE A DAY 11/23/20   [provider]  conjugated estrogens (PREMARIN) vaginal cream Estrogen Cream Instruction Discard applicator Apply pea sized amount to tip of finger to urethra before bed. Wash hands well after application. Use Monday, Wednesday and Friday Patient not taking: Reported on 12/08/2020 10/20/20   Billey Co, MD  cyanocobalamin 1000 MCG tablet Take 1,000 mcg by mouth daily.    [provider]  glipiZIDE (GLUCOTROL XL) 5 MG 24 hr tablet Take 5 mg by mouth daily. 11/19/20   [provider]  hydrocortisone (ANUSOL-HC) 2.5 % rectal cream Apply topically. 11/30/20   [provider]  hydrocortisone (ANUSOL-HC) 25 MG suppository INSERT THIS VAGINALLY DAILY X 1 WEEK. THEN CONTINUE TWICE WEEKLY X 8 WEEKS 10/25/20   [provider]  lansoprazole (PREVACID) 30 MG capsule Take 30 mg by mouth daily. 05/20/20   [provider]  Milk Thistle Extract 175 MG TABS Take by mouth.    [provider]  Multiple Vitamins-Minerals (MULTIVITAMIN WITH MINERALS) tablet Take 1 tablet by mouth daily.    [provider]  Omega-3 Fatty Acids (FISH OIL PO) Take 300 mg by mouth.    [provider]  ondansetron (ZOFRAN) 4 MG tablet Take 4 mg by mouth every 8 (eight) hours as needed. 11/16/20   [provider]  phenazopyridine (PYRIDIUM) 200 MG tablet Take 200 mg by mouth 3 (three) times daily. 11/12/20   [provider]  RABEprazole (ACIPHEX) 20 MG tablet Take 20 mg by mouth daily.    [provider]  rosuvastatin (CRESTOR) 5 MG tablet Take 5 mg by mouth daily.    [provider]  traZODone (DESYREL) 50 MG tablet Take 50 mg by mouth at bedtime. 12/07/20   [provider]  Zinc Oxide 25 % PSTE Apply topically. 11/23/20   [provider]   Esomeprazole Magnesium (NEXIUM PO) Take 22.3 mg by mouth.  09/06/19  [provider]  ipratropium (ATROVENT) 0.06 % nasal spray Place 2 sprays into both nostrils 4 (four) times daily. 3-4 times/ day 06/30/18 09/06/19  Melynda Ripple, MD  sodium chloride (OCEAN) 0.65 % SOLN nasal spray Place 2 sprays into both nostrils every 2 (two) hours while awake. 09/12/15 09/06/19  Betancourt, Aura Fey, NP    Family History Family History  Problem Relation Age of Onset   Breast cancer Sister 10    Social History Social History   Tobacco Use   Smoking status: Never   Smokeless tobacco: Never  Vaping Use   Vaping Use: Never used  Substance Use Topics   Alcohol use: No    Alcohol/week: 1.0 standard drink    Types: 1 Cans of beer per week    Comment: occassion   Drug use: No  Allergies   Citalopram, Simvastatin, Metformin and related, Pantoprazole, and Prozac [fluoxetine]   Review of Systems Review of Systems  Constitutional:  Positive for chills. Negative for activity change, appetite change, diaphoresis and fever.  Gastrointestinal:  Positive for abdominal pain and nausea. Negative for diarrhea and vomiting.  Genitourinary:  Positive for dysuria, flank pain, frequency, urgency and vaginal discharge. Negative for hematuria and vaginal pain.  Musculoskeletal:  Negative for back pain.  Hematological: Negative.   Psychiatric/Behavioral: Negative.      Physical Exam Triage Vital Signs ED Triage Vitals  Enc Vitals Group     BP 05/26/21 0945 129/60     Pulse Rate 05/26/21 0945 87     Resp 05/26/21 0945 18     Temp 05/26/21 0945 98.4 F (36.9 C)     Temp Source 05/26/21 0945 Oral     SpO2 05/26/21 0945 97 %     Weight --      Height --      Head Circumference --      Peak Flow --      Pain Score 05/26/21 0942 10     Pain Loc --      Pain Edu? --      Excl. in [place]? --    No data found.  Updated Vital Signs BP 129/60 (BP Location: Left Arm)   Pulse 87   Temp 98.4  F (36.9 C) (Oral)   Resp 18   SpO2 97%   Visual Acuity Right Eye Distance:   Left Eye Distance:   Bilateral Distance:    Right Eye Near:   Left Eye Near:    Bilateral Near:     Physical Exam Vitals and nursing note reviewed.  Constitutional:      Appearance: Normal appearance. She is ill-appearing.  HENT:     Head: Normocephalic and atraumatic.  Cardiovascular:     Rate and Rhythm: Normal rate and regular rhythm.     Pulses: Normal pulses.     Heart sounds: Normal heart sounds. No murmur heard.   No gallop.  Pulmonary:     Effort: Pulmonary effort is normal.     Breath sounds: Normal breath sounds. No wheezing, rhonchi or rales.  Abdominal:     General: Abdomen is flat. Bowel sounds are normal.     Palpations: Abdomen is soft.     Tenderness: There is abdominal tenderness. There is no right CVA tenderness, left CVA tenderness, guarding or rebound.  Skin:    General: Skin is warm and dry.     Capillary Refill: Capillary refill takes less than 2 seconds.     Findings: No erythema or rash.  Neurological:     General: No focal deficit present.     Mental Status: She is alert and oriented to person, place, and time.  Psychiatric:        Mood and Affect: Mood normal.        Behavior: Behavior normal.        Thought Content: Thought content normal.        Judgment: Judgment normal.     UC Treatments / Results  Labs (all labs ordered are listed, but only abnormal results are displayed) Labs Reviewed  WET PREP, GENITAL - Abnormal; Notable for the following components:      Result Value   WBC, Wet Prep HPF POC MODERATE (*)    All other components within normal limits  URINALYSIS, COMPLETE (UACMP) WITH MICROSCOPIC - Abnormal; Notable for the  following components:   Hgb urine dipstick SMALL (*)    Leukocytes,Ua SMALL (*)    Bacteria, UA FEW (*)    All other components within normal limits  COMPREHENSIVE METABOLIC PANEL - Abnormal; Notable for the following components:    Glucose, Bld 146 (*)    All other components within normal limits  CBC WITH DIFFERENTIAL/PLATELET    EKG   Radiology DG Abd 2 Views  Result Date: 05/26/2021 CLINICAL DATA:  Left flank pain and nausea. EXAM: ABDOMEN - 2 VIEW COMPARISON:  CT scan 05/03/2020 FINDINGS: The lung bases are clear. No pleural effusions. Moderate to large amount of stool throughout the colon and down into the rectum suggesting constipation. No distended small bowel loops to suggest obstruction. The soft tissue shadows of the abdomen are maintained. No worrisome calcifications. The bony structures are intact. IMPRESSION: Moderate to large amount of stool throughout the colon suggesting constipation. Electronically Signed   By: Marijo Sanes M.D.   On: 05/26/2021 11:47    Procedures Procedures (including critical care time)  Medications Ordered in UC Medications - No data to display  Initial Impression / Assessment and Plan / UC Course  I have reviewed the triage vital signs and the nursing notes.  Pertinent labs & imaging results that were available during my care of the patient were reviewed by me and considered in my medical decision making (see chart for details).  Patient is a 80 year old female who does look ill-appearing here for evaluation of left flank pain and nausea that been present for the past 5 days.  She also endorses chills, painful urination and urinary urgency and frequency.  Patient denies any sweating, fever, or hematuria.  Additionally, patient states that the day her symptoms started she felt something drained out of her while at the rodeo and when she checked her pain is there was a green-yellow discharge.  Patient reports that she is having flank pain that radiates around to the left lower quadrant of her abdomen.  Patient was examined at [facility] urgent care on 04/11/2021 for an exact same presentation and had an unremarkable urinalysis, urine culture that showed mixed urogenital flora, negative  gonorrhea and chlamydia and wet prep.  She was referred to OB/GYN but there is no records as her having been seen and evaluated by them.  Urinalysis was collected at triage that shows small hemoglobin, small leukocytes, 6-10 squamous epithelials with 6-10 WBCs, 16 RBCs, and few bacteria.  There is no pyuria appearing on the urinalysis and suspect that it is a contaminated specimen.  Will order CBC, CMP, and wet prep to evaluate for the presence of systemic infection.  We will also order to view the abdomen to look for signs of constipation or presence of renal stone.  Patient denies any previous history of renal stones.  If work-up is negative we will have her follow-up with her primary care doctor and OB/GYN as per her previous referral.  CMP shows a mildly elevated glucose of 146 but is otherwise unremarkable.  CBC is unremarkable.  Wet prep shows moderate WBCs but is negative for yeast, trichomonas, or clue cells.  UA shows small hemoglobin, small leukocytes, 6-10 squamous epithelials, 6-10 WBCs, 6-10 RBCs, and few bacteria.  Nitrite and protein negative.  Urinalysis appears to be a contaminated sample and I will not send it for culture.  2 view abdomen x-rays independently reviewed and evaluated by me.  Impression: There is a moderate stool burden throughout the colon with nonspecific air-fluid  levels.  There are surgical clips present in the left pelvic floor.  On the supine abdomen film there is a questionable ball of stool in the rectum.  Radiology overread is pending. Radiology impression of abdomen film is moderate to large stool burden without the colon representative of constipation.  Will discharge patient home on MiraLAX to help resolve the constipation.  There is no evidence of systemic infection on her blood work.  She has been evaluated for the same symptoms she presents with a previously and has been referred to [place].  I will have her reach out to [facility] and establish an  appointment for further evaluation and work-up.  Final Clinical Impressions(s) / UC Diagnoses   Final diagnoses:  Abdominal pain  Constipation, unspecified constipation type     Discharge Instructions      Your blood work and urinalysis did not show any signs of infection.  Your x-rays did show a large volume of stool throughout your colon causing constipation which may be what is causing some of your flank pain and left lower quadrant abdominal pain.  Take MiraLAX daily, 17 g (1 capful) in 8 ounces of a beverage of your choice, daily until you achieve regular bowel movements.  The green-yellow discharge that you are experiencing is of an unknown source.  You had the same symptoms when you saw [facility] urgent care in August and they referred you to [facility].  I recommend calling [facility] at their [place] location to establish an appointment for further evaluation.  If you develop any increasing abdominal pain, nausea, vomiting, or fever please go to the emergency room for evaluation.     ED Prescriptions   None    PDMP not reviewed this encounter.   Margarette Canada, NP 05/26/21 1201

## 2021-09-15 ENCOUNTER — Ambulatory Visit: Payer: Medicare Other | Attending: Infectious Diseases | Admitting: Infectious Diseases

## 2021-09-15 ENCOUNTER — Encounter: Payer: Self-pay | Admitting: Infectious Diseases

## 2021-09-15 ENCOUNTER — Other Ambulatory Visit: Payer: Self-pay

## 2021-09-15 VITALS — BP 146/74 | HR 77 | Temp 98.4°F | Wt 189.0 lb

## 2021-09-15 DIAGNOSIS — Z8673 Personal history of transient ischemic attack (TIA), and cerebral infarction without residual deficits: Secondary | ICD-10-CM | POA: Diagnosis not present

## 2021-09-15 DIAGNOSIS — Z09 Encounter for follow-up examination after completed treatment for conditions other than malignant neoplasm: Secondary | ICD-10-CM | POA: Diagnosis not present

## 2021-09-15 DIAGNOSIS — K5900 Constipation, unspecified: Secondary | ICD-10-CM | POA: Insufficient documentation

## 2021-09-15 DIAGNOSIS — E119 Type 2 diabetes mellitus without complications: Secondary | ICD-10-CM | POA: Insufficient documentation

## 2021-09-15 DIAGNOSIS — Z2239 Carrier of other specified bacterial diseases: Secondary | ICD-10-CM | POA: Diagnosis not present

## 2021-09-15 DIAGNOSIS — I10 Essential (primary) hypertension: Secondary | ICD-10-CM | POA: Insufficient documentation

## 2021-09-15 DIAGNOSIS — Z780 Asymptomatic menopausal state: Secondary | ICD-10-CM | POA: Diagnosis not present

## 2021-09-15 NOTE — Progress Notes (Signed)
NAME: Vicki Norris  DOB: 04-01-1941  MRN: [id number]  Date/Time: 09/15/2021 11:01 AM  REQUESTING PROVIDER: Dr.Feldpausch Subjective:  REASON FOR CONSULT: pseudomonas urine culture ? Vicki Norris is a 81 y.o. female with a history of DM, HTN, TIA is here today because of positive urine culture Pt has had sticky urethral discharge intermittently- She saw Gyn on 08/23/21 and was diagnosed with leucorrhoea. Urethra was normal but there was greenish discharge vagina noted- urine analaysis had no wbc and urine culture had pseudomonas of 25-50K Some dysuria No fever No pain abdomen  Past Medical History:  Diagnosis Date   Anemia    COVID-19 04/2021   Depression    Diabetes mellitus without complication (HCC)    GERD (gastroesophageal reflux disease)    Head ache    Hypertension    Osteoarthritis    Stroke [facility])    TIA   TIA (transient ischemic attack)     Past Surgical History:  Procedure Laterality Date   ABDOMINAL HYSTERECTOMY     CATARACT EXTRACTION Bilateral    COLONOSCOPY     COLONOSCOPY WITH PROPOFOL N/A 11/01/2017   Procedure: COLONOSCOPY WITH PROPOFOL;  Surgeon: Vicki Sails, MD;  Location: [facility] ENDOSCOPY;  Service: Endoscopy;  Laterality: N/A;   COLONOSCOPY WITH PROPOFOL N/A 01/03/2021   Procedure: COLONOSCOPY WITH PROPOFOL;  Surgeon: Vicki Rubenstein, MD;  Location: [facility] ENDOSCOPY;  Service: Endoscopy;  Laterality: N/A;   ESOPHAGOGASTRODUODENOSCOPY     ESOPHAGOGASTRODUODENOSCOPY (EGD) WITH PROPOFOL N/A 06/29/2016   Procedure: ESOPHAGOGASTRODUODENOSCOPY (EGD) WITH PROPOFOL;  Surgeon: Vicki Sails, MD;  Location: [facility] ENDOSCOPY;  Service: Endoscopy;  Laterality: N/A;   ESOPHAGOGASTRODUODENOSCOPY (EGD) WITH PROPOFOL N/A 11/01/2017   Procedure: ESOPHAGOGASTRODUODENOSCOPY (EGD) WITH PROPOFOL;  Surgeon: Vicki Sails, MD;  Location: [facility] ENDOSCOPY;  Service: Endoscopy;  Laterality: N/A;   ESOPHAGOGASTRODUODENOSCOPY (EGD) WITH PROPOFOL N/A 01/03/2021    Procedure: ESOPHAGOGASTRODUODENOSCOPY (EGD) WITH PROPOFOL;  Surgeon: Vicki Rubenstein, MD;  Location: [facility] ENDOSCOPY;  Service: Endoscopy;  Laterality: N/A;   ROTATOR CUFF REPAIR     TUBAL LIGATION      Social History   Socioeconomic History   Marital status: Married    Spouse name: Not on file   Number of children: Not on file   Years of education: Not on file   Highest education level: Not on file  Occupational History   Not on file  Tobacco Use   Smoking status: Never   Smokeless tobacco: Never  Vaping Use   Vaping Use: Never used  Substance and Sexual Activity   Alcohol use: No    Alcohol/week: 1.0 standard drink    Types: 1 Cans of beer per week    Comment: occassion   Drug use: No   Sexual activity: Not Currently    Birth control/protection: Post-menopausal  Other Topics Concern   Not on file  Social History Narrative   Not on file   Social Determinants of Health   Financial Resource Strain: Not on file  Food Insecurity: Not on file  Transportation Needs: Not on file  Physical Activity: Not on file  Stress: Not on file  Social Connections: Not on file  Intimate Partner Violence: Not on file    Family History  Problem Relation Age of Onset   Breast cancer Sister 15   Allergies  Allergen Reactions   Bupropion Swelling    Tongue swelling    Citalopram Other (See Comments)    tremors   Simvastatin Other (See Comments)  myalgias   Metformin And Related     Constipation,diarrhea   Pantoprazole Swelling   Prozac [Fluoxetine]    I? Current Outpatient Medications  Medication Sig Dispense Refill   ascorbic acid (VITAMIN C) 250 MG CHEW Chew 250 mg by mouth daily.     aspirin 81 MG tablet Take 81 mg by mouth daily.     azelastine (ASTELIN) 0.1 % nasal spray Place into the nose.     cetirizine-pseudoephedrine (ZYRTEC-D) 5-120 MG tablet Take 1 tablet by mouth daily. 30 tablet 0   cholecalciferol (VITAMIN D) 1000 units tablet Take 1,000 Units by mouth 2  (two) times daily.      cyanocobalamin 1000 MCG tablet Take 1,000 mcg by mouth daily.     glipiZIDE (GLUCOTROL XL) 5 MG 24 hr tablet Take 5 mg by mouth daily.     Milk Thistle Extract 175 MG TABS Take by mouth.     Multiple Vitamins-Minerals (MULTIVITAMIN WITH MINERALS) tablet Take 1 tablet by mouth daily.     phenazopyridine (PYRIDIUM) 200 MG tablet Take 200 mg by mouth 3 (three) times daily.     RABEprazole (ACIPHEX) 20 MG tablet Take 20 mg by mouth daily.     rosuvastatin (CRESTOR) 5 MG tablet Take 5 mg by mouth daily.     ACCU-CHEK AVIVA PLUS test strip 1 each 3 (three) times daily.     amitriptyline (ELAVIL) 25 MG tablet Take 1 tablet (25 mg total) by mouth at bedtime. (Patient not taking: Reported on 09/15/2021) 30 tablet 3   clotrimazole-betamethasone (LOTRISONE) cream APPLY TO AFFECTED AREA TWICE A DAY (Patient not taking: Reported on 09/15/2021)     hydrocortisone (ANUSOL-HC) 2.5 % rectal cream Apply topically. (Patient not taking: Reported on 09/15/2021)     hydrocortisone (ANUSOL-HC) 25 MG suppository INSERT THIS VAGINALLY DAILY X 1 WEEK. THEN CONTINUE TWICE WEEKLY X 8 WEEKS (Patient not taking: Reported on 09/15/2021)     lansoprazole (PREVACID) 30 MG capsule Take 30 mg by mouth daily. (Patient not taking: Reported on 09/15/2021)     Omega-3 Fatty Acids (FISH OIL PO) Take 300 mg by mouth. (Patient not taking: Reported on 09/15/2021)     ondansetron (ZOFRAN) 4 MG tablet Take 4 mg by mouth every 8 (eight) hours as needed. (Patient not taking: Reported on 09/15/2021)     traZODone (DESYREL) 50 MG tablet Take 50 mg by mouth at bedtime. (Patient not taking: Reported on 09/15/2021)     Zinc Oxide 25 % PSTE Apply topically. (Patient not taking: Reported on 09/15/2021)     No current facility-administered medications for this visit.     Abtx:  Anti-infectives (From admission, onward)    None       REVIEW OF SYSTEMS:  Const: negative fever, negative chills, negative weight loss Eyes: negative  diplopia or visual changes, negative eye pain ENT: negative coryza, negative sore throat Resp: negative cough, hemoptysis, dyspnea Cards: negative for chest pain, palpitations, lower extremity edema GU:  frequency, dysuria , no  hematuria GI: Negative for abdominal pain, diarrhea, bleeding, constipation Skin: negative for rash and pruritus Heme: negative for easy bruising and gum/nose bleeding [id number] low back pain Neurolo:negative for headaches, dizziness, vertigo, memory problems  Psych: anxiety Endocrine:  diabetes Allergy/Immunology- as above Objective:  VITALS:  BP (!) 146/74    Pulse 77    Temp 98.4 F (36.9 C) (Oral)    Wt 189 lb (85.7 kg)    BMI 27.91 kg/m  PHYSICAL EXAM:  General: Alert, cooperative,  no distress, appears stated age.  Head: Normocephalic, without obvious abnormality, atraumatic. Eyes: Conjunctivae clear, anicteric sclerae. Pupils are equal ENT Nares normal. No drainage or sinus tenderness. Lips, mucosa, and tongue normal. No Thrush Neck: Supple, symmetrical, no adenopathy, thyroid: non tender no carotid bruit and no JVD. Back: No CVA tenderness. Lungs: Clear to auscultation bilaterally. No Wheezing or Rhonchi. No rales. Heart: Regular rate and rhythm, no murmur, rub or gallop. Abdomen: Soft, non-tender,not distended. Bowel sounds normal. No masses Extremities: atraumatic, no cyanosis. No edema. No clubbing Skin: No rashes or lesions. Or bruising Lymph: Cervical, supraclavicular normal. Neurologic: Grossly non-focal Pertinent Labs 08/23/21 UA 0-3 wbc UC 25-50K pseudomonas ? Impression/Recommendation ? ?Pt is referred to me for pseudomonas in urine culture 25-50K pseudomonas in urine Has had positive culture in march 2022 and sept 2022 with <50 K  It is colonizing the vagina/urethra UA no wbc , so no pyuria and hence this is not a true infection  Also patient has genitourinary syndrome of menopause and possible vaginal yeast infection due to  recurrent antibiotic course Recommend not to treat her currently Explained to her the rationale for not treating colonization Will treat if systemic symptoms or before cystoscopy   She is also constipation which can worsen colonization and lead to UTI  She will follow the below plan  *Estrace /Premarin cream topically- peasized apply topically three times a week * Cetaphil to clean the genital area ( not soap)  * Cranberry supplement (-Knudsen cranberry concentrate- 1 ounce mixed with 8 ounces of water *wash with water after bowel movt *Probiotic for vaginal health( can try Pearls vaginal health) * Increase water consumption- 8 glasses a day *Ask your doctors not to check your urine on a routine basis  *Avoid antibiotics unless systemic infection/ or before cystoscopy * Kegel Exercise to strengthen pelvic floor  Avoid constipation- steel cut oats to increase fiver intake ? ___________________________________________________ Discussed with patient Follow PRN Note:  This document was prepared using Dragon voice recognition software and may include unintentional dictation errors.

## 2021-09-15 NOTE — Patient Instructions (Addendum)
You c/o sticky discharge from your urethra, your last urine examination from 08/23/21 did not show any pus cells indicating no  infection- you do have pseudomonas in the urine which is colonizing the vagina /urethra but it is not a true infection. You are constipated, avoid constipation as it can worsen colonization  Do the following to for urethral and vaginal health  *Estrace /Premarin cream topically- peasized apply topically three times a week  * Cetaphil to clean the genital area ( not soap)   * Cranberry supplement (-Knudsen cranberry concentrate- 1 ounce mixed with 8 ounces of water- drink once a day  *wash with water after bowel movt  *Probiotic for vaginal health( can try Pearls vaginal health)  * Increase water consumption- 8 glasses a day  *Ask your doctors not to check your urine on a routine basis   *Avoid antibiotics unless systemic infection/ or before cystoscopy  Avoid constipation- try steel cut oats- which has fiber-   * Kegel Exercise to strengthen pelvic floor

## 2021-09-20 ENCOUNTER — Telehealth: Payer: Self-pay

## 2021-09-20 NOTE — Telephone Encounter (Signed)
I spoke to the patient to follow up with her. Patient reports she is drinking the cranberry juice twice a week. Patient reports she was unable to tolerate probiotics. Patient does state she is feeling better.

## 2021-11-01 ENCOUNTER — Ambulatory Visit
Admission: EM | Admit: 2021-11-01 | Discharge: 2021-11-01 | Disposition: A | Discharge status: Home / Self Care | Payer: Medicare Other | Attending: Internal Medicine | Admitting: Internal Medicine

## 2021-11-01 ENCOUNTER — Other Ambulatory Visit: Payer: Self-pay

## 2021-11-01 DIAGNOSIS — R300 Dysuria: Secondary | ICD-10-CM | POA: Insufficient documentation

## 2021-11-01 LAB — URINALYSIS, MICROSCOPIC (REFLEX)

## 2021-11-01 LAB — URINALYSIS, ROUTINE W REFLEX MICROSCOPIC
Bilirubin Urine: NEGATIVE
Glucose, UA: NEGATIVE mg/dL
Ketones, ur: NEGATIVE mg/dL
Nitrite: NEGATIVE
Protein, ur: NEGATIVE mg/dL
Specific Gravity, Urine: 1.015 (ref 1.005–1.030)
pH: 6 (ref 5.0–8.0)

## 2021-11-01 MED ORDER — CEPHALEXIN 500 MG PO CAPS: 500.0000 mg | ORAL_CAPSULE | Freq: Two times a day (BID) | ORAL | 0 refills | Status: DC

## 2021-11-01 MED ORDER — CEPHALEXIN 500 MG PO CAPS: 500.0000 mg | ORAL_CAPSULE | Freq: Two times a day (BID) | ORAL | 0 refills | Status: AC

## 2021-11-01 NOTE — Discharge Instructions (Signed)
Symptoms, exam, and urinalysis today are consistent with a urinary tract infection.  Prescription for cephalexin (antibiotic) was sent to the pharmacy.  Urine culture is pending; results should be available in about 48 hours.  The urgent care will contact you if a change in treatment is needed.  Push fluids and rest.  Recheck for new fever >100.5, severe/sustained (> 1 hour) abdominal pain, persistent vomiting, or if urinary discomfort worsens. ?

## 2021-11-01 NOTE — ED Provider Notes (Signed)
?[place] ? ? ? ?CSN: [id number] ?Arrival date & time: 11/01/21  1247 ? ? ?  ? ?History   ?Chief Complaint ?Chief Complaint  ?Patient presents with  ? Ear Pain  ? Urinary Urgency  ? Headache  ? ? ?HPI ?Vicki Norris is a 81 y.o. female. presents today with urinary urgency, frequency, and dysuria, started a couple weeks ago.  Low back discomfort, suprapubic discomfort.  Discomfort during urination radiates into vagina.  Malaise, headache, particularly last few days.  Crawling sensation in her left ear, more chronic.  No drainage from ear.  Has previously been advised that there is "nothing in" her ear canal; worried about an insect, says sister had a maggot in her ear after having a crawling sensation.  Nausea in am's, no vomiting.  Sinus congestion and cough for a couple weeks, since pollen season started.   No fever.   ? ? ?Headache ? ?Past Medical History:  ?Diagnosis Date  ? Anemia   ? COVID-19 04/2021  ? Depression   ? Diabetes mellitus without complication ([place])   ? GERD (gastroesophageal reflux disease)   ? Head ache   ? Hypertension   ? Osteoarthritis   ? Stroke [facility])   ? TIA  ? TIA (transient ischemic attack)   ? ? ?Patient Active Problem List  ? Diagnosis Date Noted  ? COVID-19 04/2021  ? Acute pain of right knee 01/22/2018  ? Osteopenia 09/11/2017  ? Presbyesophagus 09/11/2017  ? Primary osteoarthritis of right knee 10/26/2016  ? Other constipation 06/19/2016  ? Hypercholesterolemia 06/08/2016  ? New onset type 2 diabetes mellitus ([place]) 06/08/2016  ? Vitamin B 12 deficiency 06/08/2016  ? Recurrent major depressive disorder ([place]) 04/14/2016  ? Headache 09/20/2015  ? Depression 01/07/2014  ? GERD (gastroesophageal reflux disease) 01/07/2014  ? ? ?Past Surgical History:  ?Procedure Laterality Date  ? ABDOMINAL HYSTERECTOMY    ? CATARACT EXTRACTION Bilateral   ? COLONOSCOPY    ? COLONOSCOPY WITH PROPOFOL N/A 11/01/2017  ? Procedure: COLONOSCOPY WITH PROPOFOL;  Surgeon: Lollie Sails, MD;   Location: [facility] ENDOSCOPY;  Service: Endoscopy;  Laterality: N/A;  ? COLONOSCOPY WITH PROPOFOL N/A 01/03/2021  ? Procedure: COLONOSCOPY WITH PROPOFOL;  Surgeon: Lesly Rubenstein, MD;  Location: [facility] ENDOSCOPY;  Service: Endoscopy;  Laterality: N/A;  ? ESOPHAGOGASTRODUODENOSCOPY    ? ESOPHAGOGASTRODUODENOSCOPY (EGD) WITH PROPOFOL N/A 06/29/2016  ? Procedure: ESOPHAGOGASTRODUODENOSCOPY (EGD) WITH PROPOFOL;  Surgeon: Lollie Sails, MD;  Location: [facility] ENDOSCOPY;  Service: Endoscopy;  Laterality: N/A;  ? ESOPHAGOGASTRODUODENOSCOPY (EGD) WITH PROPOFOL N/A 11/01/2017  ? Procedure: ESOPHAGOGASTRODUODENOSCOPY (EGD) WITH PROPOFOL;  Surgeon: Lollie Sails, MD;  Location: [facility] ENDOSCOPY;  Service: Endoscopy;  Laterality: N/A;  ? ESOPHAGOGASTRODUODENOSCOPY (EGD) WITH PROPOFOL N/A 01/03/2021  ? Procedure: ESOPHAGOGASTRODUODENOSCOPY (EGD) WITH PROPOFOL;  Surgeon: Lesly Rubenstein, MD;  Location: [facility] ENDOSCOPY;  Service: Endoscopy;  Laterality: N/A;  ? ROTATOR CUFF REPAIR    ? TUBAL LIGATION    ? ? ?Home Medications   ? ?Prior to Admission medications   ?Medication Sig Start Date End Date Taking? Authorizing Provider  ?ACCU-CHEK AVIVA PLUS test strip 1 each 3 (three) times daily. 10/24/20  Yes [provider]  ?amitriptyline (ELAVIL) 25 MG tablet Take 1 tablet (25 mg total) by mouth at bedtime. 12/08/20  Yes Billey Co, MD  ?ascorbic acid (VITAMIN C) 250 MG CHEW Chew 250 mg by mouth daily.   Yes [provider]  ?aspirin 81 MG tablet Take 81 mg by  mouth daily.   Yes [provider]  ?azelastine (ASTELIN) 0.1 % nasal spray Place into the nose. 11/08/20 11/08/21 Yes [provider]  ?cephALEXin (KEFLEX) 500 MG capsule Take 1 capsule (500 mg total) by mouth 2 (two) times daily for 5 days. 11/01/21 11/06/21 Yes Wynona Luna, MD  ?cetirizine-pseudoephedrine (ZYRTEC-D) 5-120 MG tablet Take 1 tablet by mouth daily. 09/06/19  Yes Karen Kitchens, NP  ?cholecalciferol (VITAMIN D) 1000  units tablet Take 1,000 Units by mouth 2 (two) times daily.    Yes [provider]  ?cyanocobalamin 1000 MCG tablet Take 1,000 mcg by mouth daily.   Yes [provider]  ?glipiZIDE (GLUCOTROL XL) 5 MG 24 hr tablet Take 5 mg by mouth daily. 11/19/20  Yes [provider]  ?hydrocortisone (ANUSOL-HC) 2.5 % rectal cream Apply topically. 11/30/20  Yes [provider]  ?hydrocortisone (ANUSOL-HC) 25 MG suppository  10/25/20  Yes [provider]  ?lansoprazole (PREVACID) 30 MG capsule Take 30 mg by mouth daily. 05/20/20  Yes [provider]  ?Milk Thistle Extract 175 MG TABS Take by mouth.   Yes [provider]  ?Multiple Vitamins-Minerals (MULTIVITAMIN WITH MINERALS) tablet Take 1 tablet by mouth daily.   Yes [provider]  ?Omega-3 Fatty Acids (FISH OIL PO) Take 300 mg by mouth.   Yes [provider]  ?ondansetron (ZOFRAN) 4 MG tablet Take 4 mg by mouth every 8 (eight) hours as needed. 11/16/20  Yes [provider]  ?phenazopyridine (PYRIDIUM) 200 MG tablet Take 200 mg by mouth 3 (three) times daily. 11/12/20  Yes [provider]  ?rosuvastatin (CRESTOR) 5 MG tablet Take 5 mg by mouth daily.   Yes [provider]  ?Esomeprazole Magnesium (NEXIUM PO) Take 22.3 mg by mouth.  09/06/19  [provider]  ?ipratropium (ATROVENT) 0.06 % nasal spray Place 2 sprays into both nostrils 4 (four) times daily. 3-4 times/ day 06/30/18 09/06/19  Melynda Ripple, MD  ?sodium chloride (OCEAN) 0.65 % SOLN nasal spray Place 2 sprays into both nostrils every 2 (two) hours while awake. 09/12/15 09/06/19  Betancourt, Aura Fey, NP  ? ? ?Family History ?Family History  ?Problem Relation Age of Onset  ? Breast cancer Sister 11  ? ? ?Social History ?Social History  ? ?Tobacco Use  ? Smoking status: Never  ? Smokeless tobacco: Never  ?Vaping Use  ? Vaping Use: Never used  ?Substance Use Topics  ? Alcohol use: No  ?  Alcohol/week: 1.0 standard  drink  ?  Types: 1 Cans of beer per week  ?  Comment: occassion  ? Drug use: No  ? ? ? ?Allergies   ?Bupropion, Citalopram, Simvastatin, Metformin and related, Pantoprazole, and Prozac [fluoxetine] ? ? ?Review of Systems ?Review of Systems  ?Neurological:  Positive for headaches.  ? ? ?Physical Exam ?Triage Vital Signs ?ED Triage Vitals  ?Enc Vitals Group  ?   BP 11/01/21 1321 130/78  ?   Pulse Rate 11/01/21 1321 85  ?   Resp 11/01/21 1321 18  ?   Temp 11/01/21 1321 98.2 ?F (36.8 ?C)  ?   Temp Source 11/01/21 1321 Oral  ?   SpO2 11/01/21 1321 97 %  ?   Weight 11/01/21 1318 186 lb (84.4 kg)  ?   Height 11/01/21 1318 '5\' 9"'$  (1.753 m)  ?   Pain Score 11/01/21 1318 10  ?   Pain Loc --   ? ? ?Updated Vital Signs ?BP 130/78 (BP Location:  Left Arm)   Pulse 85   Temp 98.2 ?F (36.8 ?C) (Oral)   Resp 18   Ht '5\' 9"'$  (1.753 m)   Wt 84.4 kg   SpO2 97%   BMI 27.47 kg/m?  ? ?Physical Exam ?Constitutional:   ?   General: She is not in acute distress. ?   Appearance: She is not ill-appearing or toxic-appearing.  ?   Comments: Good hygiene  ?HENT:  ?   Head:  ?   Comments: B TMs opaque, no erythema.  Ear canals clean bilaterally, no wax residue, no insect or other foreign body appreciated ?Mod nasal congestion bilaterally, some mucusy material present ?Post pharynx deep pink ?   Mouth/Throat:  ?   Mouth: Mucous membranes are moist.  ?Eyes:  ?   Conjunctiva/sclera:  ?   Right eye: Right conjunctiva is not injected. No exudate. ?   Left eye: Left conjunctiva is not injected. No exudate. ?   Comments: Conjugate gaze observed  ?Neck:  ?   Comments: Turns head freely ?Cardiovascular:  ?   Rate and Rhythm: Normal rate and regular rhythm.  ?Pulmonary:  ?   Effort: Pulmonary effort is normal. No respiratory distress.  ?   Breath sounds: No wheezing or rhonchi.  ?Musculoskeletal:  ?   Comments: Walked into urgent care independently, able to climb on/off exam table without help  ?Skin: ?   General: Skin is warm and dry.  ?   Comments:  Pink, no cyanosis  ?Neurological:  ?   Mental Status: She is alert.  ?   Comments: Face symmetric, speech clear/coherent/logical  ? ? ? ?UC Treatments / Results  ?Labs ?(all labs ordered are listed, but only abnor

## 2021-11-01 NOTE — ED Triage Notes (Signed)
Pt c/o urinary urgency, back pain x1week headache x3days, and drainage and pain in the left ear x42month?

## 2021-11-03 LAB — URINE CULTURE: Culture: NO GROWTH

## 2021-11-25 ENCOUNTER — Ambulatory Visit
Admission: EM | Admit: 2021-11-25 | Discharge: 2021-11-25 | Disposition: A | Discharge status: Home / Self Care | Payer: Medicare Other | Attending: Physician Assistant | Admitting: Physician Assistant

## 2021-11-25 DIAGNOSIS — G44209 Tension-type headache, unspecified, not intractable: Secondary | ICD-10-CM

## 2021-11-25 DIAGNOSIS — Z79899 Other long term (current) drug therapy: Secondary | ICD-10-CM | POA: Diagnosis not present

## 2021-11-25 DIAGNOSIS — Z7982 Long term (current) use of aspirin: Secondary | ICD-10-CM | POA: Diagnosis not present

## 2021-11-25 DIAGNOSIS — Z20822 Contact with and (suspected) exposure to covid-19: Secondary | ICD-10-CM | POA: Diagnosis not present

## 2021-11-25 DIAGNOSIS — K219 Gastro-esophageal reflux disease without esophagitis: Secondary | ICD-10-CM | POA: Diagnosis not present

## 2021-11-25 LAB — RESP PANEL BY RT-PCR (FLU A&B, COVID) ARPGX2
Influenza A by PCR: NEGATIVE
Influenza B by PCR: NEGATIVE
SARS Coronavirus 2 by RT PCR: NEGATIVE

## 2021-11-25 NOTE — ED Triage Notes (Signed)
Pt asks for a covid test. ? ?Pt c/o headache, sneezing, body aches x3days ?

## 2021-11-25 NOTE — ED Provider Notes (Signed)
?[place] ? ? ? ?CSN: [id number] ?Arrival date & time: 11/25/21  1301 ? ? ?  ? ?History   ?Chief Complaint ?Chief Complaint  ?Patient presents with  ? Generalized Body Aches  ? Headache  ? ? ?HPI ?Vicki Norris is a 81 y.o. female presenting for concerns regarding right-sided neck pain and occipital headache that has been coming and going over the past 3 days.  Patient also reports increased sneezing and feeling little achy.  Additionally she has had cough and throat irritation but she believes that is related to flareup of her acid reflux.  Patient recently started back on her PPI.  Throat irritation and cough has improved since resuming that.  Patient unsure if her headache is related to the medication or not but she says she is taking the medication performing fine.  She denies any associated fevers, sinus pain, chest pain, breathing difficulty, nausea/vomiting or diarrhea.  No sick contacts.  She would like to be tested for COVID-19.  She reports she has taken aspirin and Coricidin HBP as well as rested and symptoms improved.  Patient reports she had a headache prior to arrival to urgent care but now no longer has a headache.  Patient says her scalp is almost tender and so is her neck.  A little bit of increased pain when she moves her neck.  No radiation of pain down the extremities or numbness/tingling or weakness.  No associated dizziness, confusion, speech or balance issues.  Patient denies history of stroke but she does have history of TIA in her chart.  Patient has no other complaints today. ? ?HPI ? ?Past Medical History:  ?Diagnosis Date  ? Anemia   ? COVID-19 04/2021  ? Depression   ? Diabetes mellitus without complication ([place])   ? GERD (gastroesophageal reflux disease)   ? Head ache   ? Hypertension   ? Osteoarthritis   ? Stroke [facility])   ? TIA  ? TIA (transient ischemic attack)   ? ? ?Patient Active Problem List  ? Diagnosis Date Noted  ? COVID-19 04/2021  ? Acute pain of right knee  01/22/2018  ? Osteopenia 09/11/2017  ? Presbyesophagus 09/11/2017  ? Primary osteoarthritis of right knee 10/26/2016  ? Other constipation 06/19/2016  ? Hypercholesterolemia 06/08/2016  ? New onset type 2 diabetes mellitus ([place]) 06/08/2016  ? Vitamin B 12 deficiency 06/08/2016  ? Recurrent major depressive disorder ([place]) 04/14/2016  ? Headache 09/20/2015  ? Depression 01/07/2014  ? GERD (gastroesophageal reflux disease) 01/07/2014  ? ? ?Past Surgical History:  ?Procedure Laterality Date  ? ABDOMINAL HYSTERECTOMY    ? CATARACT EXTRACTION Bilateral   ? COLONOSCOPY    ? COLONOSCOPY WITH PROPOFOL N/A 11/01/2017  ? Procedure: COLONOSCOPY WITH PROPOFOL;  Surgeon: Lollie Sails, MD;  Location: [facility] ENDOSCOPY;  Service: Endoscopy;  Laterality: N/A;  ? COLONOSCOPY WITH PROPOFOL N/A 01/03/2021  ? Procedure: COLONOSCOPY WITH PROPOFOL;  Surgeon: Lesly Rubenstein, MD;  Location: [facility] ENDOSCOPY;  Service: Endoscopy;  Laterality: N/A;  ? ESOPHAGOGASTRODUODENOSCOPY    ? ESOPHAGOGASTRODUODENOSCOPY (EGD) WITH PROPOFOL N/A 06/29/2016  ? Procedure: ESOPHAGOGASTRODUODENOSCOPY (EGD) WITH PROPOFOL;  Surgeon: Lollie Sails, MD;  Location: [facility] ENDOSCOPY;  Service: Endoscopy;  Laterality: N/A;  ? ESOPHAGOGASTRODUODENOSCOPY (EGD) WITH PROPOFOL N/A 11/01/2017  ? Procedure: ESOPHAGOGASTRODUODENOSCOPY (EGD) WITH PROPOFOL;  Surgeon: Lollie Sails, MD;  Location: [facility] ENDOSCOPY;  Service: Endoscopy;  Laterality: N/A;  ? ESOPHAGOGASTRODUODENOSCOPY (EGD) WITH PROPOFOL N/A 01/03/2021  ? Procedure: ESOPHAGOGASTRODUODENOSCOPY (EGD) WITH PROPOFOL;  Surgeon: Lesly Rubenstein, MD;  Location: [facility] ENDOSCOPY;  Service: Endoscopy;  Laterality: N/A;  ? ROTATOR CUFF REPAIR    ? TUBAL LIGATION    ? ? ?OB History   ?No obstetric history on file. ?  ? ? ? ?Home Medications   ? ?Prior to Admission medications   ?Medication Sig Start Date End Date Taking? Authorizing Provider  ?ACCU-CHEK AVIVA PLUS test strip 1 each 3 (three) times daily. 10/24/20   Yes [provider]  ?ascorbic acid (VITAMIN C) 250 MG CHEW Chew 250 mg by mouth daily.   Yes [provider]  ?aspirin 81 MG tablet Take 81 mg by mouth daily.   Yes [provider]  ?cetirizine-pseudoephedrine (ZYRTEC-D) 5-120 MG tablet Take 1 tablet by mouth daily. 09/06/19  Yes Karen Kitchens, NP  ?cholecalciferol (VITAMIN D) 1000 units tablet Take 1,000 Units by mouth 2 (two) times daily.    Yes [provider]  ?cyanocobalamin 1000 MCG tablet Take 1,000 mcg by mouth daily.   Yes [provider]  ?glipiZIDE (GLUCOTROL XL) 5 MG 24 hr tablet Take 5 mg by mouth daily. 11/19/20  Yes [provider]  ?Milk Thistle Extract 175 MG TABS Take by mouth.   Yes [provider]  ?Multiple Vitamins-Minerals (MULTIVITAMIN WITH MINERALS) tablet Take 1 tablet by mouth daily.   Yes [provider]  ?rosuvastatin (CRESTOR) 5 MG tablet Take 5 mg by mouth daily.   Yes [provider]  ?amitriptyline (ELAVIL) 25 MG tablet Take 1 tablet (25 mg total) by mouth at bedtime. 12/08/20   Billey Co, MD  ?azelastine (ASTELIN) 0.1 % nasal spray Place into the nose. 11/08/20 11/08/21  [provider]  ?hydrocortisone (ANUSOL-HC) 2.5 % rectal cream Apply topically. 11/30/20   [provider]  ?hydrocortisone (ANUSOL-HC) 25 MG suppository  10/25/20   [provider]  ?lansoprazole (PREVACID) 30 MG capsule Take 30 mg by mouth daily. 05/20/20   [provider]  ?Omega-3 Fatty Acids (FISH OIL PO) Take 300 mg by mouth.    [provider]  ?ondansetron (ZOFRAN) 4 MG tablet Take 4 mg by mouth every 8 (eight) hours as needed. 11/16/20   [provider]  ?phenazopyridine (PYRIDIUM) 200 MG tablet Take 200 mg by mouth 3 (three) times daily. 11/12/20   [provider]  ?Esomeprazole Magnesium (NEXIUM PO) Take 22.3 mg by mouth.  09/06/19  [provider]  ?ipratropium (ATROVENT) 0.06 % nasal spray Place 2 sprays  into both nostrils 4 (four) times daily. 3-4 times/ day 06/30/18 09/06/19  Melynda Ripple, MD  ?sodium chloride (OCEAN) 0.65 % SOLN nasal spray Place 2 sprays into both nostrils every 2 (two) hours while awake. 09/12/15 09/06/19  Betancourt, Aura Fey, NP  ? ? ?Family History ?Family History  ?Problem Relation Age of Onset  ? Breast cancer Sister 7  ? ? ?Social History ?Social History  ? ?Tobacco Use  ? Smoking status: Never  ? Smokeless tobacco: Never  ?Vaping Use  ? Vaping Use: Never used  ?Substance Use Topics  ? Alcohol use: No  ?  Alcohol/week: 1.0 standard drink  ?  Types: 1 Cans of beer per week  ?  Comment: occassion  ? Drug use: No  ? ? ? ?Allergies   ?Bupropion, Citalopram, Simvastatin, Metformin and related, Pantoprazole, and Prozac [fluoxetine] ? ? ?Review of Systems ?Review of Systems  ?Constitutional:  Negative for chills, diaphoresis, fatigue and fever.  ?HENT:  Positive for sneezing and  sore throat. Negative for congestion, ear pain, rhinorrhea, sinus pressure and sinus pain.   ?Respiratory:  Positive for cough. Negative for shortness of breath.   ?Gastrointestinal:  Negative for abdominal pain, nausea and vomiting.  ?Musculoskeletal:  Positive for myalgias and neck pain. Negative for arthralgias.  ?Skin:  Negative for rash.  ?Neurological:  Positive for headaches. Negative for dizziness and weakness.  ?Hematological:  Negative for adenopathy.  ? ? ?Physical Exam ?Triage Vital Signs ?ED Triage Vitals  ?Enc Vitals Group  ?   BP 11/25/21 1331 125/60  ?   Pulse Rate 11/25/21 1331 80  ?   Resp 11/25/21 1331 18  ?   Temp 11/25/21 1331 98 ?F (36.7 ?C)  ?   Temp Source 11/25/21 1331 Oral  ?   SpO2 11/25/21 1331 97 %  ?   Weight 11/25/21 1328 189 lb (85.7 kg)  ?   Height 11/25/21 1328 '5\' 9"'$  (1.753 m)  ?   Head Circumference --   ?   Peak Flow --   ?   Pain Score 11/25/21 1327 10  ?   Pain Loc --   ?   Pain Edu? --   ?   Excl. in [place]? --   ? ?No data found. ? ?Updated Vital Signs ?BP 125/60 (BP Location: Left  Arm)   Pulse 80   Temp 98 ?F (36.7 ?C) (Oral)   Resp 18   Ht '5\' 9"'$  (1.753 m)   Wt 189 lb (85.7 kg)   SpO2 97%   BMI 27.91 kg/m?  ? ?   ? ?Physical Exam ?Vitals and nursing note reviewed.  ?Constitutional:

## 2021-11-25 NOTE — Discharge Instructions (Signed)
-  We will call you if your COVID test is positive.  If it is positive against an antiviral medicine for you and you will need to isolate 5 days and wear a mask for 5 days. ?- Your headache is consistent with a tension type headache.  See handout. ?- You can apply heat to the area, muscle rubs, lidocaine patches and take aspirin and Tylenol.  You can also take your home muscle relaxer.  Stretch your neck.  Avoid painful activities.  Rest. ?-If your headache becomes more severe and you have any symptoms like dizziness, weakness, nausea/vomiting, feeling faint or passing out, confusion, tingling or numbness, speech or balance problems, you need to call 911 or have someone take to the ER. ?- If you are still having these headaches next week, contact her PCP. ?

## 2022-06-04 IMAGING — CR DG ABDOMEN 2V
4 series · 4 of 4 positions shown · non-contrast
Comparison: CT scan 05/03/2020

CLINICAL DATA: Left flank pain and nausea.

EXAM:
ABDOMEN - 2 VIEW

[abdomen erect (1 of 2)]
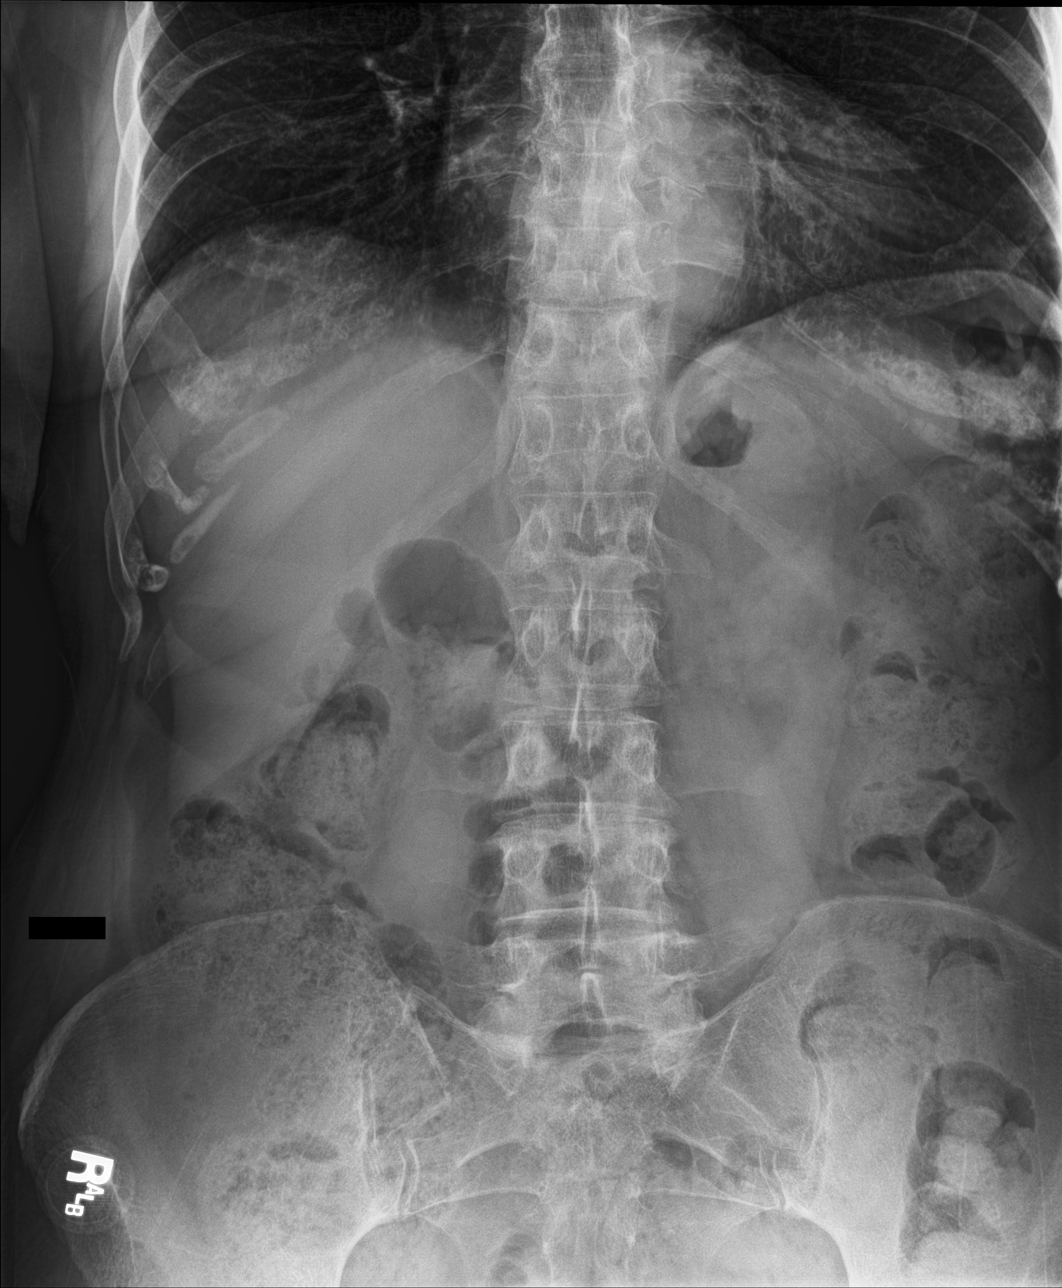

[abdomen erect (2 of 2)]
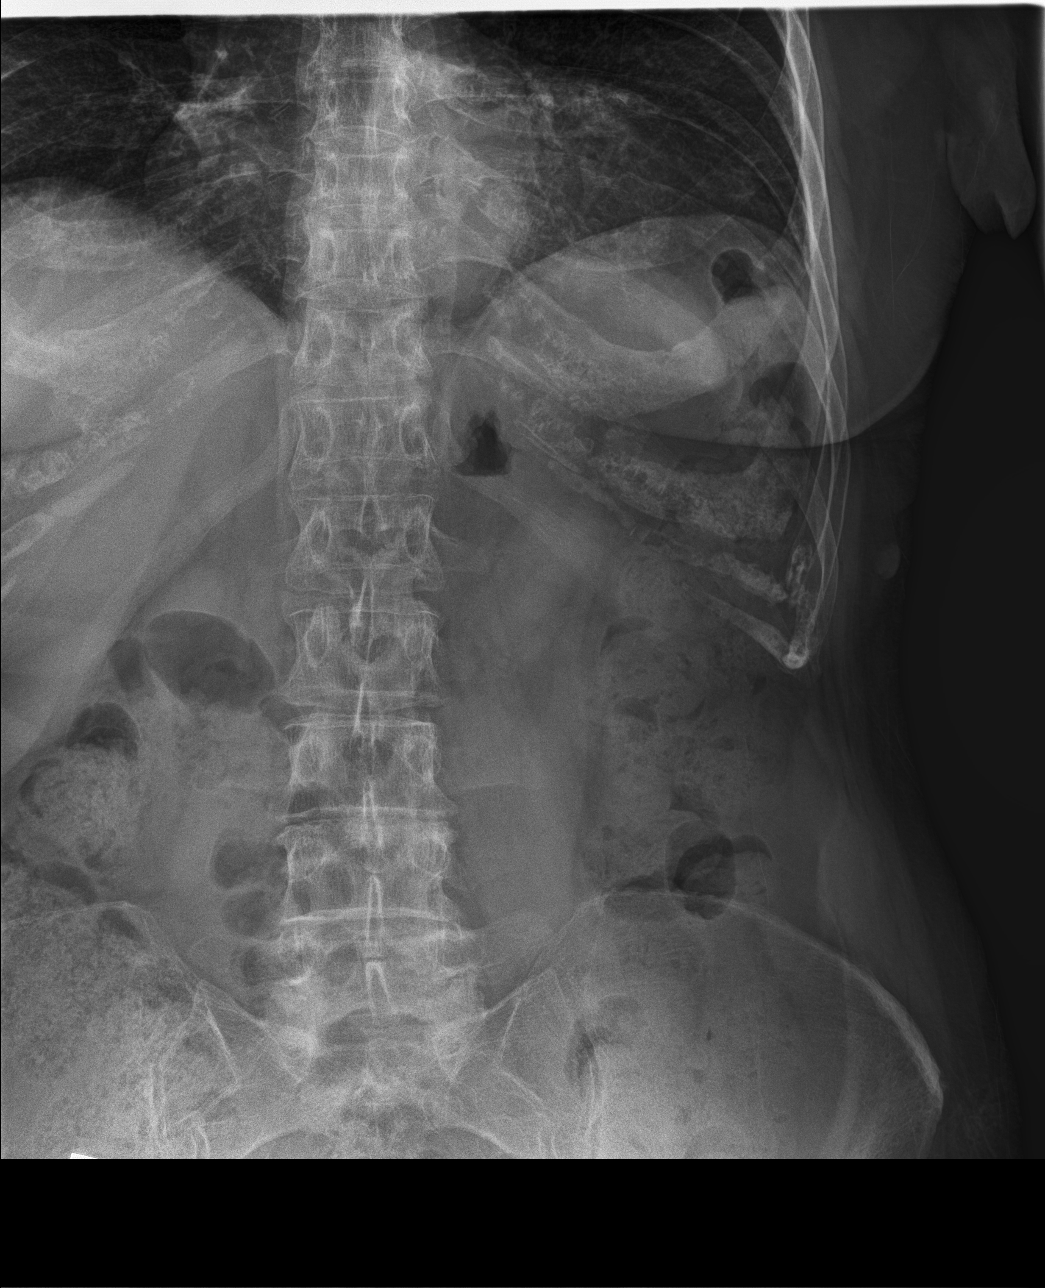

[abdomen supine (1 of 2)]
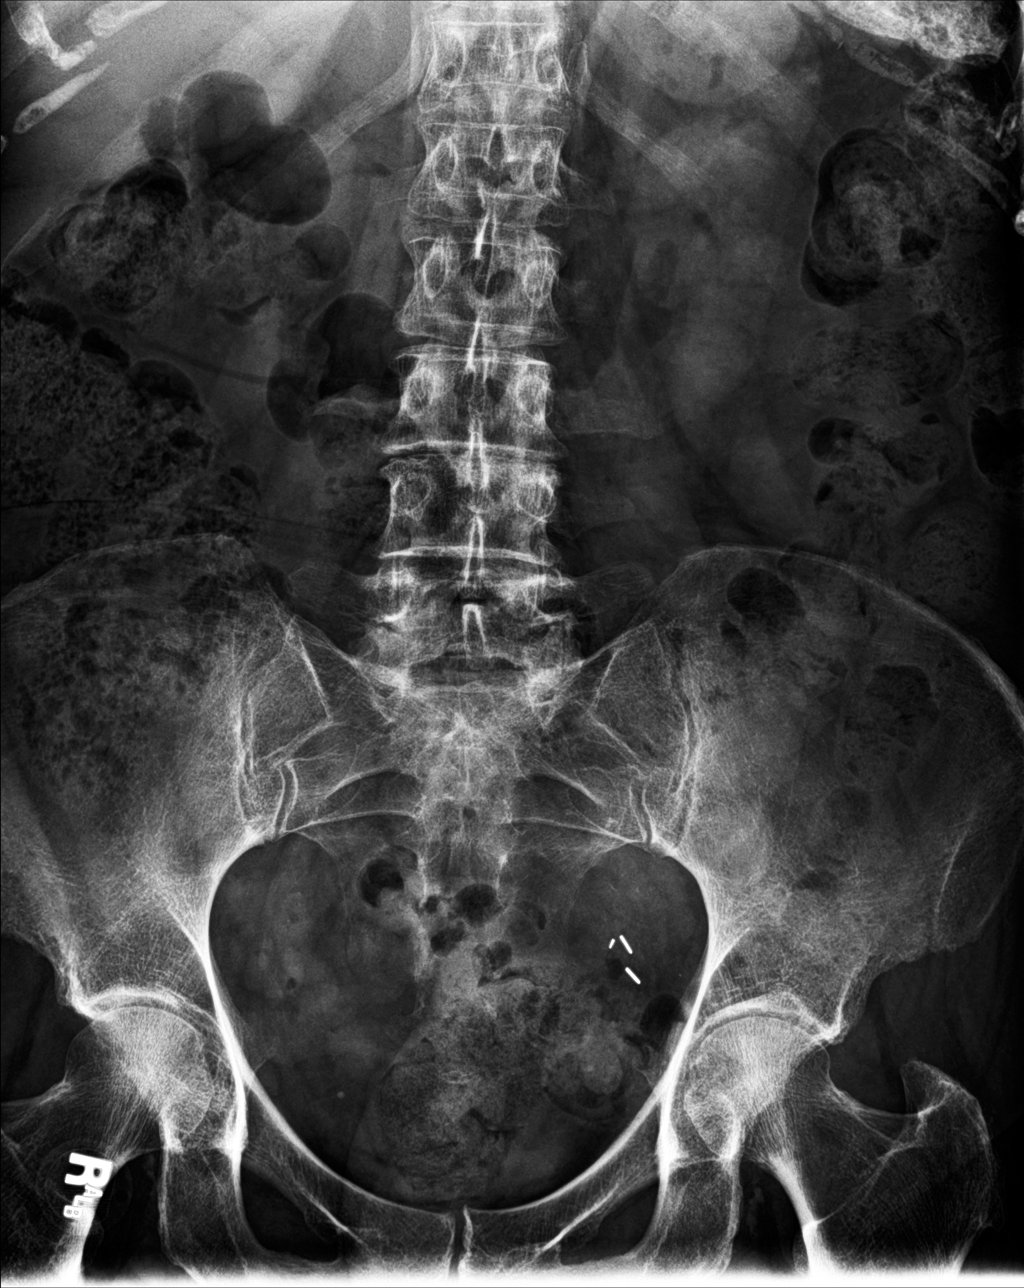

[abdomen supine (2 of 2)]
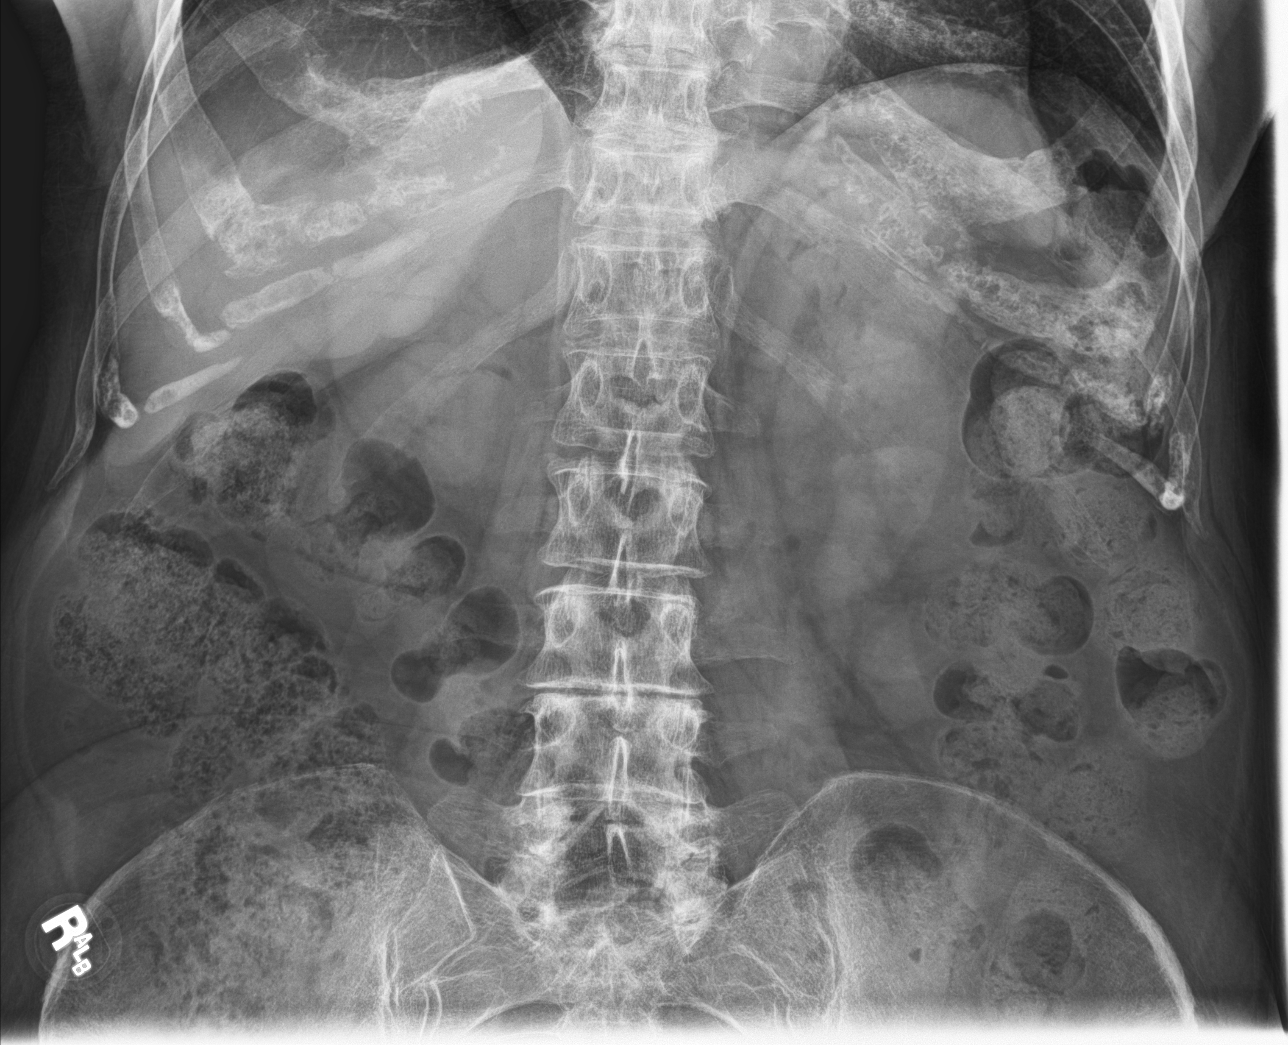

[4 of 4 positions shown; findings below may reference images not displayed]

FINDINGS: The lung bases are clear. No pleural effusions.

Moderate to large amount of stool throughout the colon and down into
the rectum suggesting constipation. No distended small bowel loops
to suggest obstruction.

The soft tissue shadows of the abdomen are maintained. No worrisome
calcifications. The bony structures are intact.
IMPRESSION: Moderate to large amount of stool throughout the colon suggesting
constipation.

## 2022-07-13 ENCOUNTER — Ambulatory Visit
Admission: EM | Admit: 2022-07-13 | Discharge: 2022-07-13 | Disposition: A | Discharge status: Home / Self Care | Payer: Medicare Other | Attending: Physician Assistant | Admitting: Physician Assistant

## 2022-07-13 ENCOUNTER — Ambulatory Visit (INDEPENDENT_AMBULATORY_CARE_PROVIDER_SITE_OTHER): Payer: Medicare Other

## 2022-07-13 DIAGNOSIS — R059 Cough, unspecified: Secondary | ICD-10-CM | POA: Diagnosis not present

## 2022-07-13 DIAGNOSIS — Z20822 Contact with and (suspected) exposure to covid-19: Secondary | ICD-10-CM | POA: Insufficient documentation

## 2022-07-13 DIAGNOSIS — J069 Acute upper respiratory infection, unspecified: Secondary | ICD-10-CM | POA: Diagnosis not present

## 2022-07-13 DIAGNOSIS — R0989 Other specified symptoms and signs involving the circulatory and respiratory systems: Secondary | ICD-10-CM | POA: Diagnosis not present

## 2022-07-13 DIAGNOSIS — Z8616 Personal history of COVID-19: Secondary | ICD-10-CM | POA: Insufficient documentation

## 2022-07-13 DIAGNOSIS — J0100 Acute maxillary sinusitis, unspecified: Secondary | ICD-10-CM | POA: Insufficient documentation

## 2022-07-13 DIAGNOSIS — R0981 Nasal congestion: Secondary | ICD-10-CM | POA: Diagnosis present

## 2022-07-13 LAB — RESP PANEL BY RT-PCR (RSV, FLU A&B, COVID)  RVPGX2
Influenza A by PCR: NEGATIVE
Influenza B by PCR: NEGATIVE
Resp Syncytial Virus by PCR: NEGATIVE
SARS Coronavirus 2 by RT PCR: NEGATIVE

## 2022-07-13 MED ORDER — FLUTICASONE PROPIONATE 50 MCG/ACT NA SUSP: 1.0000 | Freq: Every day | NASAL | 2 refills | Status: AC

## 2022-07-13 MED ORDER — GUAIFENESIN-DM 100-10 MG/5ML PO SYRP: 5.0000 mL | ORAL_SOLUTION | ORAL | 0 refills | Status: DC | PRN

## 2022-07-13 MED ORDER — AZITHROMYCIN 250 MG PO TABS: ORAL_TABLET | ORAL | 0 refills | Status: DC

## 2022-07-13 NOTE — ED Provider Notes (Signed)
[facility]    CSN: [id number] Arrival date & time: 07/13/22  0802      History   Chief Complaint Chief Complaint  Patient presents with   Nasal Congestion    HPI Toye Rouillard is a 81 y.o. female.   Patient is a 81 year old female who presents with chief complaint of ongoing sinus infection times about 1 month.  She was seen by primary care on October 20 and diagnosed with sinusitis and given a course of antibiotics with amoxicillin.  She reports still having nasal congestion ear fullness as well as drainage that is hard to get up.  Also reports production of yellow mucus both coughing up and from her nose.  She also feels like she is staggering around the house and concerned that she may fall.  In addition amoxicillin she been taking Robitussin and Mucinex.  Patient reports her son is feeling ill as well but states she does not know of any other sick contacts.    Past Medical History:  Diagnosis Date   Anemia    COVID-19 04/2021   Depression    Diabetes mellitus without complication (HCC)    GERD (gastroesophageal reflux disease)    Head ache    Hypertension    Osteoarthritis    Stroke [facility])    TIA   TIA (transient ischemic attack)     Patient Active Problem List   Diagnosis Date Noted   COVID-19 04/2021   Acute pain of right knee 01/22/2018   Osteopenia 09/11/2017   Presbyesophagus 09/11/2017   Primary osteoarthritis of right knee 10/26/2016   Other constipation 06/19/2016   Hypercholesterolemia 06/08/2016   New onset type 2 diabetes mellitus ([place]) 06/08/2016   Vitamin B 12 deficiency 06/08/2016   Recurrent major depressive disorder ([place]) 04/14/2016   Headache 09/20/2015   Depression 01/07/2014   GERD (gastroesophageal reflux disease) 01/07/2014    Past Surgical History:  Procedure Laterality Date   ABDOMINAL HYSTERECTOMY     CATARACT EXTRACTION Bilateral    COLONOSCOPY     COLONOSCOPY WITH PROPOFOL N/A 11/01/2017   Procedure:  COLONOSCOPY WITH PROPOFOL;  Surgeon: Lollie Sails, MD;  Location: [facility] ENDOSCOPY;  Service: Endoscopy;  Laterality: N/A;   COLONOSCOPY WITH PROPOFOL N/A 01/03/2021   Procedure: COLONOSCOPY WITH PROPOFOL;  Surgeon: Lesly Rubenstein, MD;  Location: [facility] ENDOSCOPY;  Service: Endoscopy;  Laterality: N/A;   ESOPHAGOGASTRODUODENOSCOPY     ESOPHAGOGASTRODUODENOSCOPY (EGD) WITH PROPOFOL N/A 06/29/2016   Procedure: ESOPHAGOGASTRODUODENOSCOPY (EGD) WITH PROPOFOL;  Surgeon: Lollie Sails, MD;  Location: [facility] ENDOSCOPY;  Service: Endoscopy;  Laterality: N/A;   ESOPHAGOGASTRODUODENOSCOPY (EGD) WITH PROPOFOL N/A 11/01/2017   Procedure: ESOPHAGOGASTRODUODENOSCOPY (EGD) WITH PROPOFOL;  Surgeon: Lollie Sails, MD;  Location: [facility] ENDOSCOPY;  Service: Endoscopy;  Laterality: N/A;   ESOPHAGOGASTRODUODENOSCOPY (EGD) WITH PROPOFOL N/A 01/03/2021   Procedure: ESOPHAGOGASTRODUODENOSCOPY (EGD) WITH PROPOFOL;  Surgeon: Lesly Rubenstein, MD;  Location: [facility] ENDOSCOPY;  Service: Endoscopy;  Laterality: N/A;   ROTATOR CUFF REPAIR     TUBAL LIGATION      OB History   No obstetric history on file.      Home Medications    Prior to Admission medications   Medication Sig Start Date End Date Taking? Authorizing Provider  ACCU-CHEK AVIVA PLUS test strip 1 each 3 (three) times daily. 10/24/20  Yes [provider]  ascorbic acid (VITAMIN C) 250 MG CHEW Chew 250 mg by mouth daily.   Yes [provider]  aspirin 81 MG tablet  Take 81 mg by mouth daily.   Yes [provider]  azithromycin (ZITHROMAX Z-PAK) 250 MG tablet Take 2 tablets by mouth the first day followed by one tablet daily for next 4 days. 07/13/22  Yes Luvenia Redden, PA-C  cetirizine-pseudoephedrine (ZYRTEC-D) 5-120 MG tablet Take 1 tablet by mouth daily. 09/06/19  Yes Karen Kitchens, NP  cholecalciferol (VITAMIN D) 1000 units tablet Take 1,000 Units by mouth 2 (two) times daily.    Yes [provider]   cyanocobalamin 1000 MCG tablet Take 1,000 mcg by mouth daily.   Yes [provider]  fluticasone (FLONASE) 50 MCG/ACT nasal spray Place 1 spray into both nostrils daily. 07/13/22  Yes Luvenia Redden, PA-C  glipiZIDE (GLUCOTROL XL) 5 MG 24 hr tablet Take 5 mg by mouth daily. 11/19/20  Yes [provider]  guaiFENesin-dextromethorphan (ROBITUSSIN DM) 100-10 MG/5ML syrup Take 5 mLs by mouth every 4 (four) hours as needed for cough. 07/13/22  Yes Luvenia Redden, PA-C  lansoprazole (PREVACID) 30 MG capsule Take 30 mg by mouth daily. 05/20/20  Yes [provider]  Milk Thistle Extract 175 MG TABS Take by mouth.   Yes [provider]  Multiple Vitamins-Minerals (MULTIVITAMIN WITH MINERALS) tablet Take 1 tablet by mouth daily.   Yes [provider]  ondansetron (ZOFRAN) 4 MG tablet Take 4 mg by mouth every 8 (eight) hours as needed. 11/16/20  Yes [provider]  phenazopyridine (PYRIDIUM) 200 MG tablet Take 200 mg by mouth 3 (three) times daily. 11/12/20  Yes [provider]  rosuvastatin (CRESTOR) 5 MG tablet Take 5 mg by mouth daily.   Yes [provider]  azelastine (ASTELIN) 0.1 % nasal spray Place into the nose. 11/08/20 11/08/21  [provider]  Esomeprazole Magnesium (NEXIUM PO) Take 22.3 mg by mouth.  09/06/19  [provider]  ipratropium (ATROVENT) 0.06 % nasal spray Place 2 sprays into both nostrils 4 (four) times daily. 3-4 times/ day 06/30/18 09/06/19  Melynda Ripple, MD  sodium chloride (OCEAN) 0.65 % SOLN nasal spray Place 2 sprays into both nostrils every 2 (two) hours while awake. 09/12/15 09/06/19  Betancourt, Aura Fey, NP    Family History Family History  Problem Relation Age of Onset   Breast cancer Sister 90    Social History Social History   Tobacco Use   Smoking status: Never   Smokeless tobacco: Never  Vaping Use   Vaping Use: Never used  Substance Use Topics   Alcohol use: No     Alcohol/week: 1.0 standard drink of alcohol    Types: 1 Cans of beer per week    Comment: occassion   Drug use: No     Allergies   Bupropion, Citalopram, Simvastatin, Metformin and related, Pantoprazole, and Prozac [fluoxetine]   Review of Systems Review of Systems as noted in HPI.  Other systems reviewed and found to be negative   Physical Exam Triage Vital Signs ED Triage Vitals  Enc Vitals Group     BP 07/13/22 0819 131/80     Pulse Rate 07/13/22 0819 92     Resp 07/13/22 0819 18     Temp 07/13/22 0819 99 F (37.2 C)     Temp Source 07/13/22 0819 Oral     SpO2 07/13/22 0819 94 %     Weight 07/13/22 0816 193 lb (87.5 kg)     Height 07/13/22 0816 '5\' 9"'$  (1.753 m)     Head Circumference --  Peak Flow --      Pain Score 07/13/22 0816 8     Pain Loc --      Pain Edu? --      Excl. in [place]? --    No data found.  Updated Vital Signs BP 131/80 (BP Location: Left Arm)   Pulse 92   Temp 99 F (37.2 C) (Oral)   Resp 18   Ht '5\' 9"'$  (1.753 m)   Wt 193 lb (87.5 kg)   SpO2 94%   BMI 28.50 kg/m   Visual Acuity Right Eye Distance:   Left Eye Distance:   Bilateral Distance:    Right Eye Near:   Left Eye Near:    Bilateral Near:     Physical Exam Constitutional:      Appearance: Normal appearance.  HENT:     Head: Normocephalic and atraumatic.     Right Ear: A middle ear effusion is present. Tympanic membrane is not erythematous.     Left Ear: A middle ear effusion is present. Tympanic membrane is not erythematous.     Nose: Congestion present.     Right Sinus: Maxillary sinus tenderness and frontal sinus tenderness present.     Left Sinus: Maxillary sinus tenderness and frontal sinus tenderness present.  Pulmonary:     Effort: Pulmonary effort is normal.     Breath sounds: Rales (bilateral bases) present. No wheezing or rhonchi.  Musculoskeletal:     Cervical back: Normal range of motion.  Lymphadenopathy:     Cervical: Cervical adenopathy (left) present.   Neurological:     General: No focal deficit present.     Mental Status: She is alert and oriented to person, place, and time.      UC Treatments / Results  Labs (all labs ordered are listed, but only abnormal results are displayed) Labs Reviewed  RESP PANEL BY RT-PCR (RSV, FLU A&B, COVID)  [id number]    EKG   Radiology DG Chest 2 View  Result Date: 07/13/2022 CLINICAL DATA:  Cough and congestion for 1 month. EXAM: CHEST - 2 VIEW COMPARISON:  Radiographs 07/20/2018. FINDINGS: The heart size and mediastinal contours are normal. The lungs are clear. There is no pleural effusion or pneumothorax. No acute osseous findings are identified. IMPRESSION: Stable chest.  No evidence of active cardiopulmonary process. Electronically Signed   By: Richardean Sale M.D.   On: 07/13/2022 09:30    Procedures Procedures (including critical care time)  Medications Ordered in UC Medications - No data to display  Initial Impression / Assessment and Plan / UC Course  I have reviewed the triage vital signs and the nursing notes.  Pertinent labs & imaging results that were available during my care of the patient were reviewed by me and considered in my medical decision making (see chart for details).     Patient presents with over 1 month of acute sinusitis.  Status post 10-day course of amoxicillin given by primary care.  Still feeling congested with yellow mucus production.  Bilateral middle ear effusions.  Rales noted in bilateral bases.  Will check a chest x-ray as well as a viral swab.  Chest x-ray without any acute process. Viral swab negative.  Given symptoms we will give her a prescription for Z-Pak given continued symptoms despite amoxicillin.  Also give her prescription for Flonase in the morning as well as Robitussin DM.  Have her take an over-the-counter allergy medicine at night.  Have her follow-up primary care if no improvement  Final Clinical Impressions(s) / UC Diagnoses   Final  diagnoses:  Acute maxillary sinusitis, recurrence not specified  Upper respiratory tract infection, unspecified type     Discharge Instructions      -azithromycin: Take 2 tablets by mouth the first day followed by one tablet daily for next 4 days. -Flonase: 1 spray to each nostril in the mornings -Would take over-the-counter allergy medication (Zyrtec, Allegra, Claritin) at night -Nettie pot or nasal saline rinse to help flush out the sinuses     ED Prescriptions     Medication Sig Dispense Auth. Provider   azithromycin (ZITHROMAX Z-PAK) 250 MG tablet Take 2 tablets by mouth the first day followed by one tablet daily for next 4 days. 6 tablet Luvenia Redden, PA-C   fluticasone [facility]) 50 MCG/ACT nasal spray Place 1 spray into both nostrils daily. 16 g Luvenia Redden, PA-C   guaiFENesin-dextromethorphan (ROBITUSSIN DM) 100-10 MG/5ML syrup Take 5 mLs by mouth every 4 (four) hours as needed for cough. 118 mL Luvenia Redden, PA-C      PDMP not reviewed this encounter.   Luvenia Redden, PA-C 07/13/22 [id number]

## 2022-07-13 NOTE — Discharge Instructions (Signed)
-  azithromycin: Take 2 tablets by mouth the first day followed by one tablet daily for next 4 days. -Flonase: 1 spray to each nostril in the mornings -Would take over-the-counter allergy medication (Zyrtec, Allegra, Claritin) at night -Nettie pot or nasal saline rinse to help flush out the sinuses

## 2022-07-13 NOTE — ED Triage Notes (Signed)
Pt c/o nausea, nasal congestion, left side ear pain and facial pain x31month Pt states that the congestion sits in her throat and she has to forcefully swallow it and it makes her sick.  Pt was given Amoxicillin '800mg'$  in October and pt states that it did not help.   Pt has tried saline nose drops to help with nose dryness and it has not helped.

## 2022-09-01 ENCOUNTER — Ambulatory Visit
Admission: EM | Admit: 2022-09-01 | Discharge: 2022-09-01 | Disposition: A | Discharge status: Home / Self Care | Payer: Medicare Other | Attending: Emergency Medicine | Admitting: Emergency Medicine

## 2022-09-01 DIAGNOSIS — N200 Calculus of kidney: Secondary | ICD-10-CM | POA: Insufficient documentation

## 2022-09-01 DIAGNOSIS — J0100 Acute maxillary sinusitis, unspecified: Secondary | ICD-10-CM

## 2022-09-01 LAB — URINALYSIS, ROUTINE W REFLEX MICROSCOPIC
Bilirubin Urine: NEGATIVE
Glucose, UA: NEGATIVE mg/dL
Ketones, ur: NEGATIVE mg/dL
Nitrite: NEGATIVE
Protein, ur: NEGATIVE mg/dL
Specific Gravity, Urine: 1.01 (ref 1.005–1.030)
pH: 6 (ref 5.0–8.0)

## 2022-09-01 LAB — URINALYSIS, MICROSCOPIC (REFLEX)

## 2022-09-01 LAB — WET PREP, GENITAL
Clue Cells Wet Prep HPF POC: NONE SEEN
Sperm: NONE SEEN
Trich, Wet Prep: NONE SEEN
WBC, Wet Prep HPF POC: >=10 — AB (ref <10)
Yeast Wet Prep HPF POC: NONE SEEN

## 2022-09-01 MED ORDER — AZITHROMYCIN 250 MG PO TABS: 250.0000 mg | ORAL_TABLET | Freq: Every day | ORAL | 0 refills | Status: DC

## 2022-09-01 MED ORDER — TAMSULOSIN HCL 0.4 MG PO CAPS: 0.4000 mg | ORAL_CAPSULE | Freq: Every day | ORAL | 0 refills | Status: AC

## 2022-09-01 MED ORDER — MELOXICAM 7.5 MG PO TABS: 7.5000 mg | ORAL_TABLET | Freq: Every day | ORAL | 0 refills | Status: DC

## 2022-09-01 MED ORDER — ONDANSETRON 4 MG PO TBDP: 4.0000 mg | ORAL_TABLET | Freq: Three times a day (TID) | ORAL | 0 refills | Status: DC | PRN

## 2022-09-01 MED ORDER — IPRATROPIUM BROMIDE 0.03 % NA SOLN: 2.0000 | Freq: Two times a day (BID) | NASAL | 12 refills | Status: DC

## 2022-09-01 NOTE — ED Provider Notes (Signed)
[facility]    CSN: [id number] Arrival date & time: 09/01/22  0810      History   Chief Complaint Chief Complaint  Patient presents with   Urinary Frequency    HPI Vicki Norris is a 82 y.o. female.   Patient presents for evaluation of bilateral lower back pain, lower abdominal pain, dysuria, urinary frequency and nausea without vomiting team present for 7 days.  Associated vaginal odor but denies itching, irritation or discharge.  Has not attempted treatment but endorses she drinks cranberry juice regularly.  Denies hematuria, fevers.    Patient endorses chest congestion and sensation of mucus to the throat along with sinus pressure, nasal congestion and headaches present since November 2023.  Has been evaluated by PCP, ear nose and throat doctor and urgent care for same symptoms.  Mildly improved with use of antibiotics but continue to persist.  Denies shortness of breath, wheezing or fevers.  Has been tolerating food and liquids.  Has been taking nasal sprays and Zyrtec-D intermittently since symptoms began.     Past Medical History:  Diagnosis Date   Anemia    COVID-19 04/2021   Depression    Diabetes mellitus without complication (HCC)    GERD (gastroesophageal reflux disease)    Head ache    Hypertension    Osteoarthritis    Stroke [facility])    TIA   TIA (transient ischemic attack)     Patient Active Problem List   Diagnosis Date Noted   COVID-19 04/2021   Acute pain of right knee 01/22/2018   Osteopenia 09/11/2017   Presbyesophagus 09/11/2017   Primary osteoarthritis of right knee 10/26/2016   Other constipation 06/19/2016   Hypercholesterolemia 06/08/2016   New onset type 2 diabetes mellitus ([place]) 06/08/2016   Vitamin B 12 deficiency 06/08/2016   Recurrent major depressive disorder ([place]) 04/14/2016   Headache 09/20/2015   Depression 01/07/2014   GERD (gastroesophageal reflux disease) 01/07/2014    Past Surgical History:  Procedure  Laterality Date   ABDOMINAL HYSTERECTOMY     CATARACT EXTRACTION Bilateral    COLONOSCOPY     COLONOSCOPY WITH PROPOFOL N/A 11/01/2017   Procedure: COLONOSCOPY WITH PROPOFOL;  Surgeon: Lollie Sails, MD;  Location: [facility] ENDOSCOPY;  Service: Endoscopy;  Laterality: N/A;   COLONOSCOPY WITH PROPOFOL N/A 01/03/2021   Procedure: COLONOSCOPY WITH PROPOFOL;  Surgeon: Lesly Rubenstein, MD;  Location: [facility] ENDOSCOPY;  Service: Endoscopy;  Laterality: N/A;   ESOPHAGOGASTRODUODENOSCOPY     ESOPHAGOGASTRODUODENOSCOPY (EGD) WITH PROPOFOL N/A 06/29/2016   Procedure: ESOPHAGOGASTRODUODENOSCOPY (EGD) WITH PROPOFOL;  Surgeon: Lollie Sails, MD;  Location: [facility] ENDOSCOPY;  Service: Endoscopy;  Laterality: N/A;   ESOPHAGOGASTRODUODENOSCOPY (EGD) WITH PROPOFOL N/A 11/01/2017   Procedure: ESOPHAGOGASTRODUODENOSCOPY (EGD) WITH PROPOFOL;  Surgeon: Lollie Sails, MD;  Location: [facility] ENDOSCOPY;  Service: Endoscopy;  Laterality: N/A;   ESOPHAGOGASTRODUODENOSCOPY (EGD) WITH PROPOFOL N/A 01/03/2021   Procedure: ESOPHAGOGASTRODUODENOSCOPY (EGD) WITH PROPOFOL;  Surgeon: Lesly Rubenstein, MD;  Location: [facility] ENDOSCOPY;  Service: Endoscopy;  Laterality: N/A;   ROTATOR CUFF REPAIR     TUBAL LIGATION      OB History   No obstetric history on file.      Home Medications    Prior to Admission medications   Medication Sig Start Date End Date Taking? Authorizing Provider  ACCU-CHEK AVIVA PLUS test strip 1 each 3 (three) times daily. 10/24/20  Yes [provider]  ascorbic acid (VITAMIN C) 250 MG CHEW Chew 250 mg by mouth daily.  Yes [provider]  aspirin 81 MG tablet Take 81 mg by mouth daily.   Yes [provider]  cetirizine-pseudoephedrine (ZYRTEC-D) 5-120 MG tablet Take 1 tablet by mouth daily. 09/06/19  Yes Karen Kitchens, NP  cholecalciferol (VITAMIN D) 1000 units tablet Take 1,000 Units by mouth 2 (two) times daily.    Yes [provider]  cyanocobalamin 1000  MCG tablet Take 1,000 mcg by mouth daily.   Yes [provider]  fluticasone (FLONASE) 50 MCG/ACT nasal spray Place 1 spray into both nostrils daily. 07/13/22  Yes Luvenia Redden, PA-C  glipiZIDE (GLUCOTROL XL) 5 MG 24 hr tablet Take 5 mg by mouth daily. 11/19/20  Yes [provider]  lansoprazole (PREVACID) 30 MG capsule Take 30 mg by mouth daily. 05/20/20  Yes [provider]  Milk Thistle Extract 175 MG TABS Take by mouth.   Yes [provider]  Multiple Vitamins-Minerals (MULTIVITAMIN WITH MINERALS) tablet Take 1 tablet by mouth daily.   Yes [provider]  ondansetron (ZOFRAN) 4 MG tablet Take 4 mg by mouth every 8 (eight) hours as needed. 11/16/20  Yes [provider]  phenazopyridine (PYRIDIUM) 200 MG tablet Take 200 mg by mouth 3 (three) times daily. 11/12/20  Yes [provider]  rosuvastatin (CRESTOR) 5 MG tablet Take 5 mg by mouth daily.   Yes [provider]  azelastine (ASTELIN) 0.1 % nasal spray Place into the nose. 11/08/20 11/08/21  [provider]  azithromycin (ZITHROMAX Z-PAK) 250 MG tablet Take 2 tablets by mouth the first day followed by one tablet daily for next 4 days. 07/13/22   Luvenia Redden, PA-C  guaiFENesin-dextromethorphan (ROBITUSSIN DM) 100-10 MG/5ML syrup Take 5 mLs by mouth every 4 (four) hours as needed for cough. 07/13/22   Luvenia Redden, PA-C  Esomeprazole Magnesium (NEXIUM PO) Take 22.3 mg by mouth.  09/06/19  [provider]  ipratropium (ATROVENT) 0.06 % nasal spray Place 2 sprays into both nostrils 4 (four) times daily. 3-4 times/ day 06/30/18 09/06/19  Melynda Ripple, MD  sodium chloride (OCEAN) 0.65 % SOLN nasal spray Place 2 sprays into both nostrils every 2 (two) hours while awake. 09/12/15 09/06/19  Betancourt, Aura Fey, NP    Family History Family History  Problem Relation Age of Onset   Breast cancer Sister 59    Social History Social History   Tobacco Use    Smoking status: Never   Smokeless tobacco: Never  Vaping Use   Vaping Use: Never used  Substance Use Topics   Alcohol use: No    Alcohol/week: 1.0 standard drink of alcohol    Types: 1 Cans of beer per week    Comment: occassion   Drug use: No     Allergies   Bupropion, Citalopram, Simvastatin, Metformin and related, Pantoprazole, and Prozac [fluoxetine]   Review of Systems Review of Systems  HENT:  Positive for congestion, rhinorrhea, sinus pressure, sinus pain and sneezing. Negative for dental problem, drooling, ear discharge, ear pain, facial swelling, hearing loss, mouth sores, nosebleeds, postnasal drip, sore throat, tinnitus, trouble swallowing and voice change.   Respiratory:  Positive for cough. Negative for apnea, choking, chest tightness, shortness of breath, wheezing and stridor.   Genitourinary:  Positive for dysuria, flank pain, frequency and pelvic pain. Negative for decreased urine volume, difficulty urinating, dyspareunia, enuresis, genital sores, hematuria, menstrual problem, urgency, vaginal bleeding, vaginal discharge and vaginal pain.     Physical Exam Triage Vital Signs ED Triage  Vitals  Enc Vitals Group     BP 09/01/22 0825 119/78     Pulse Rate 09/01/22 0825 87     Resp 09/01/22 0825 18     Temp 09/01/22 0825 99.1 F (37.3 C)     Temp Source 09/01/22 0825 Oral     SpO2 09/01/22 0825 92 %     Weight 09/01/22 0822 194 lb (88 kg)     Height 09/01/22 0822 '5\' 9"'$  (1.753 m)     Head Circumference --      Peak Flow --      Pain Score 09/01/22 0821 0     Pain Loc --      Pain Edu? --      Excl. in [place]? --    No data found.  Updated Vital Signs BP 119/78 (BP Location: Left Arm)   Pulse 87   Temp 99.1 F (37.3 C) (Oral)   Resp 18   Ht '5\' 9"'$  (1.753 m)   Wt 194 lb (88 kg)   SpO2 92%   BMI 28.65 kg/m   Visual Acuity Right Eye Distance:   Left Eye Distance:   Bilateral Distance:    Right Eye Near:   Left Eye Near:    Bilateral Near:      Physical Exam Constitutional:      Appearance: Normal appearance.  HENT:     Head: Normocephalic.     Right Ear: Tympanic membrane and external ear normal.     Left Ear: Tympanic membrane, ear canal and external ear normal.     Nose: Congestion and rhinorrhea present.     Right Sinus: Maxillary sinus tenderness present. No frontal sinus tenderness.     Left Sinus: Maxillary sinus tenderness present. No frontal sinus tenderness.     Mouth/Throat:     Mouth: Mucous membranes are moist.     Pharynx: No posterior oropharyngeal erythema.  Eyes:     Extraocular Movements: Extraocular movements intact.  Cardiovascular:     Rate and Rhythm: Normal rate and regular rhythm.     Pulses: Normal pulses.     Heart sounds: Normal heart sounds.  Pulmonary:     Effort: Pulmonary effort is normal.     Breath sounds: Normal breath sounds.  Abdominal:     General: Abdomen is flat. Bowel sounds are normal.     Palpations: Abdomen is soft.     Tenderness: There is abdominal tenderness in the right lower quadrant, suprapubic area and left lower quadrant. There is right CVA tenderness. There is no left CVA tenderness.  Skin:    General: Skin is warm and dry.  Neurological:     Mental Status: She is alert and oriented to person, place, and time. Mental status is at baseline.      UC Treatments / Results  Labs (all labs ordered are listed, but only abnormal results are displayed) Labs Reviewed  WET PREP, GENITAL - Abnormal; Notable for the following components:      Result Value   WBC, Wet Prep HPF POC >=10 (*)    All other components within normal limits  URINALYSIS, ROUTINE W REFLEX MICROSCOPIC - Abnormal; Notable for the following components:   Hgb urine dipstick MODERATE (*)    Leukocytes,Ua TRACE (*)    All other components within normal limits  URINALYSIS, MICROSCOPIC (REFLEX) - Abnormal; Notable for the following components:   Bacteria, UA FEW (*)    All other components within  normal limits  EKG   Radiology No results found.  Procedures Procedures (including critical care time)  Medications Ordered in UC Medications - No data to display  Initial Impression / Assessment and Plan / UC Course  I have reviewed the triage vital signs and the nursing notes.  Pertinent labs & imaging results that were available during my care of the patient were reviewed by me and considered in my medical decision making (see chart for details).  Nephrolithiasis, recurrent maxillary sinusitis  Urinalysis showing no signs of infection but calcium crystals visualized, wet prep negative, discussed findings with patient, vitals are stable and she has no signs of distress nontoxic-appearing, stable for outpatient management, prescribed Flomax, Zofran and meloxicam for management advise follow-up with PCP in 1 to 2 weeks and given strict precautions for worsening symptoms to go to the nearest emergency department  Presentation is consistent with sinusitis, symptoms have been present since November 2023, she has attempted multiple antibiotics likely not bacterial however requesting Z-Pak today therefore obliged, additionally prescribed ipratropium and advised patient to begin use of antihistamine daily, advised follow-up with ear nose and throat within 1 to 2 weeks for reevaluation of symptoms Final Clinical Impressions(s) / UC Diagnoses   Final diagnoses:  None   Discharge Instructions   None    ED Prescriptions   None    PDMP not reviewed this encounter.   Hans Eden, [place] 09/01/22 [phone]

## 2022-09-01 NOTE — ED Triage Notes (Signed)
Pt c/o chest congestion x66month; urinary frequency, lower back pain, abdominal pain, nausea x1week  Pt states that she has been waking up through the night because of frequent urination.   Pt does not want to be on prednisone because she felt bad during the last few doses of medication.

## 2022-09-01 NOTE — Discharge Instructions (Signed)
For your kidneys -Your urinalysis showed calcium stones but no signs of infection.  -Take Tamsulosin on every day before bed for 14 days. This medication relaxes the urethra and will allow the stone to move through easier  -You may use zofran every 8 hours as needed for nausea. - may use meloxicam every morning as needed  -At any point if your pain becomes more severe, urine in blood begins or increases, you become lightheaded or dizzy please go to the nearest emergency department for further evaluation   - please follow up with your doctor in 1-2 weeks for reevaluation  For your sinuses -Begin azithromycin sent for coverage for bacteria however as you have had multiple antibiotics it is most likely that bacteria is not causing your symptoms if there has been no improvement -Begin taking your Zyrtec D every day to help manage congestion -Begin use of ipratropium nasal spray every morning and every evening to further help clear sinuses -Please schedule follow-up appointment with your ear nose and throat specialist for reevaluation

## 2022-11-29 ENCOUNTER — Ambulatory Visit
Admission: EM | Admit: 2022-11-29 | Discharge: 2022-11-29 | Disposition: A | Discharge status: Home / Self Care | Payer: Medicare Other | Attending: Emergency Medicine | Admitting: Emergency Medicine

## 2022-11-29 DIAGNOSIS — R350 Frequency of micturition: Secondary | ICD-10-CM | POA: Insufficient documentation

## 2022-11-29 DIAGNOSIS — R1032 Left lower quadrant pain: Secondary | ICD-10-CM | POA: Diagnosis not present

## 2022-11-29 DIAGNOSIS — R1031 Right lower quadrant pain: Secondary | ICD-10-CM | POA: Diagnosis not present

## 2022-11-29 DIAGNOSIS — Z8616 Personal history of COVID-19: Secondary | ICD-10-CM | POA: Diagnosis not present

## 2022-11-29 DIAGNOSIS — M545 Low back pain, unspecified: Secondary | ICD-10-CM | POA: Diagnosis not present

## 2022-11-29 DIAGNOSIS — B3731 Acute candidiasis of vulva and vagina: Secondary | ICD-10-CM | POA: Diagnosis present

## 2022-11-29 LAB — URINALYSIS, W/ REFLEX TO CULTURE (INFECTION SUSPECTED)
Bilirubin Urine: NEGATIVE
Glucose, UA: NEGATIVE mg/dL
Ketones, ur: NEGATIVE mg/dL
Nitrite: NEGATIVE
Protein, ur: NEGATIVE mg/dL
Specific Gravity, Urine: 1.02 (ref 1.005–1.030)
pH: 6 (ref 5.0–8.0)

## 2022-11-29 MED ORDER — FLUCONAZOLE 150 MG PO TABS: 150.0000 mg | ORAL_TABLET | Freq: Every day | ORAL | 0 refills | Status: AC

## 2022-11-29 NOTE — Discharge Instructions (Addendum)
Urine negative for infection , positive for yeast  Urine has been sent to the lab to determine if bacteria will guide, if this occurs you will be notified and antibiotics sent to the pharmacy  Take 1 Diflucan fill today then in 3 days if you are still having symptoms take second tablet   In addition:   Avoid baths, hot tubs and whirlpool spas.  Don't use scented or harsh soaps, such as those with deodorant or antibacterial action. Avoid irritants. These include scented tampons and pads. Wipe from front to back after using the toilet.  Don't douche. Your vagina doesn't require cleansing other than normal bathing.  Use a  condom. Wear cotton underwear, this fabric helps absorb moisture

## 2022-11-29 NOTE — ED Triage Notes (Signed)
Pt c/o bilateral back pain from below ribs to upper thigh x3days  Pt states that she is having urinary frequency.

## 2022-11-29 NOTE — ED Provider Notes (Signed)
[facility]    CSN: [id number] Arrival date & time: 11/29/22  0831      History   Chief Complaint Chief Complaint  Patient presents with  . Back Pain  . Urinary Frequency    HPI Vicki Norris is a 82 y.o. female.   Patient presents for evaluation of bilateral lower back pain radiating down to the bilateral thighs, lower abdominal pain and pressure radiating to the back, urinary frequency, cloudy urine and yellow vaginal discharge present for 3 days.  Has not attempted treatment.  Denies sexual activity.  Denies fevers, vaginal itching, irritation or odor, hematuria, dysuria.    Past Medical History:  Diagnosis Date  . Anemia   . COVID-19 04/2021  . Depression   . Diabetes mellitus without complication   . GERD (gastroesophageal reflux disease)   . Head ache   . Hypertension   . Osteoarthritis   . Stroke    TIA  . TIA (transient ischemic attack)     Patient Active Problem List   Diagnosis Date Noted  . COVID-19 04/2021  . Acute pain of right knee 01/22/2018  . Osteopenia 09/11/2017  . Presbyesophagus 09/11/2017  . Primary osteoarthritis of right knee 10/26/2016  . Other constipation 06/19/2016  . Hypercholesterolemia 06/08/2016  . New onset type 2 diabetes mellitus 06/08/2016  . Vitamin B 12 deficiency 06/08/2016  . Recurrent major depressive disorder 04/14/2016  . Headache 09/20/2015  . Depression 01/07/2014  . GERD (gastroesophageal reflux disease) 01/07/2014    Past Surgical History:  Procedure Laterality Date  . ABDOMINAL HYSTERECTOMY    . CATARACT EXTRACTION Bilateral   . COLONOSCOPY    . COLONOSCOPY WITH PROPOFOL N/A 11/01/2017   Procedure: COLONOSCOPY WITH PROPOFOL;  Surgeon: Christena Deem, MD;  Location: [facility] ENDOSCOPY;  Service: Endoscopy;  Laterality: N/A;  . COLONOSCOPY WITH PROPOFOL N/A 01/03/2021   Procedure: COLONOSCOPY WITH PROPOFOL;  Surgeon: Regis Bill, MD;  Location: [facility] ENDOSCOPY;  Service: Endoscopy;   Laterality: N/A;  . ESOPHAGOGASTRODUODENOSCOPY    . ESOPHAGOGASTRODUODENOSCOPY (EGD) WITH PROPOFOL N/A 06/29/2016   Procedure: ESOPHAGOGASTRODUODENOSCOPY (EGD) WITH PROPOFOL;  Surgeon: Christena Deem, MD;  Location: [facility] ENDOSCOPY;  Service: Endoscopy;  Laterality: N/A;  . ESOPHAGOGASTRODUODENOSCOPY (EGD) WITH PROPOFOL N/A 11/01/2017   Procedure: ESOPHAGOGASTRODUODENOSCOPY (EGD) WITH PROPOFOL;  Surgeon: Christena Deem, MD;  Location: [facility] ENDOSCOPY;  Service: Endoscopy;  Laterality: N/A;  . ESOPHAGOGASTRODUODENOSCOPY (EGD) WITH PROPOFOL N/A 01/03/2021   Procedure: ESOPHAGOGASTRODUODENOSCOPY (EGD) WITH PROPOFOL;  Surgeon: Regis Bill, MD;  Location: [facility] ENDOSCOPY;  Service: Endoscopy;  Laterality: N/A;  . ROTATOR CUFF REPAIR    . TUBAL LIGATION      OB History   No obstetric history on file.      Home Medications    Prior to Admission medications   Medication Sig Start Date End Date Taking? Authorizing Provider  ACCU-CHEK AVIVA PLUS test strip 1 each 3 (three) times daily. 10/24/20  Yes [provider]  aspirin 81 MG tablet Take 81 mg by mouth daily.   Yes [provider]  cholecalciferol (VITAMIN D) 1000 units tablet Take 1,000 Units by mouth 2 (two) times daily.    Yes [provider]  cyanocobalamin 1000 MCG tablet Take 1,000 mcg by mouth daily.   Yes [provider]  fluticasone (FLONASE) 50 MCG/ACT nasal spray Place 1 spray into both nostrils daily. 07/13/22  Yes Candis Schatz, PA-C  glipiZIDE (GLUCOTROL XL) 5 MG 24 hr tablet Take 5 mg by  mouth daily. 11/19/20  Yes [provider]  ipratropium (ATROVENT) 0.03 % nasal spray Place 2 sprays into both nostrils every 12 (twelve) hours. 09/01/22  Yes Johnelle Tafolla R, NP  lansoprazole (PREVACID) 30 MG capsule Take 30 mg by mouth daily. 05/20/20  Yes [provider]  meloxicam (MOBIC) 7.5 MG tablet Take 1 tablet (7.5 mg total) by mouth daily. 09/01/22  Yes Lynsey Ange, Elita Boone, NP  Milk Thistle Extract 175 MG TABS Take by mouth.   Yes [provider]  Multiple Vitamins-Minerals (MULTIVITAMIN WITH MINERALS) tablet Take 1 tablet by mouth daily.   Yes [provider]  ondansetron (ZOFRAN) 4 MG tablet Take 4 mg by mouth every 8 (eight) hours as needed. 11/16/20  Yes [provider]  ondansetron (ZOFRAN-ODT) 4 MG disintegrating tablet Take 1 tablet (4 mg total) by mouth every 8 (eight) hours as needed for nausea or vomiting. 09/01/22  Yes Dorette Hartel R, NP  phenazopyridine (PYRIDIUM) 200 MG tablet Take 200 mg by mouth 3 (three) times daily. 11/12/20  Yes [provider]  rosuvastatin (CRESTOR) 5 MG tablet Take 5 mg by mouth daily.   Yes [provider]  tamsulosin (FLOMAX) 0.4 MG CAPS capsule Take 1 capsule (0.4 mg total) by mouth daily. 09/01/22  Yes Avery Eustice R, NP  ascorbic acid (VITAMIN C) 250 MG CHEW Chew 250 mg by mouth daily.    [provider]  azelastine (ASTELIN) 0.1 % nasal spray Place into the nose. 11/08/20 11/08/21  [provider]  azithromycin (ZITHROMAX) 250 MG tablet Take 1 tablet (250 mg total) by mouth daily. Take first 2 tablets together, then 1 every day until finished. 09/01/22   Brigido Mera, Elita Boone, NP  cetirizine-pseudoephedrine (ZYRTEC-D) 5-120 MG tablet Take 1 tablet by mouth daily. 09/06/19   Verlee Monte, NP  guaiFENesin-dextromethorphan (ROBITUSSIN DM) 100-10 MG/5ML syrup Take 5 mLs by mouth every 4 (four) hours as needed for cough. 07/13/22   Candis Schatz, PA-C  Esomeprazole Magnesium (NEXIUM PO) Take 22.3 mg by mouth.  09/06/19  [provider]  sodium chloride (OCEAN) 0.65 % SOLN nasal spray Place 2 sprays into both nostrils every 2 (two) hours while awake. 09/12/15 09/06/19  Betancourt, Jarold Song, NP    Family History Family History  Problem Relation Age of Onset  . Breast cancer Sister 40    Social History Social History   Tobacco Use  . Smoking status: Never   . Smokeless tobacco: Never  Vaping Use  . Vaping Use: Never used  Substance Use Topics  . Alcohol use: No    Alcohol/week: 1.0 standard drink of alcohol    Types: 1 Cans of beer per week    Comment: occassion  . Drug use: No     Allergies   Bupropion, Citalopram, Simvastatin, Metformin and related, Pantoprazole, and Prozac [fluoxetine]   Review of Systems Review of Systems  Genitourinary:  Positive for flank pain, frequency, pelvic pain and vaginal discharge. Negative for decreased urine volume, difficulty urinating, dyspareunia, dysuria, enuresis, genital sores, hematuria, menstrual problem, urgency, vaginal bleeding and vaginal pain.     Physical Exam Triage Vital Signs ED Triage Vitals  Enc Vitals Group     BP 11/29/22 0848 127/67     Pulse Rate 11/29/22 0848 79     Resp --      Temp 11/29/22 0848 98.9 F (37.2 C)     Temp Source 11/29/22 0848 Oral     SpO2 11/29/22 0848 95 %  Weight --      Height --      Head Circumference --      Peak Flow --      Pain Score 11/29/22 0846 8     Pain Loc --      Pain Edu? --      Excl. in GC? --    No data found.  Updated Vital Signs BP 127/67 (BP Location: Left Arm)   Pulse 79   Temp 98.9 F (37.2 C) (Oral)   SpO2 95%   Visual Acuity Right Eye Distance:   Left Eye Distance:   Bilateral Distance:    Right Eye Near:   Left Eye Near:    Bilateral Near:     Physical Exam Constitutional:      Appearance: Normal appearance.  Eyes:     Extraocular Movements: Extraocular movements intact.  Pulmonary:     Effort: Pulmonary effort is normal.  Abdominal:     General: Abdomen is flat. Bowel sounds are normal.     Palpations: Abdomen is soft.     Tenderness: There is abdominal tenderness in the suprapubic area. There is no right CVA tenderness or left CVA tenderness.  Neurological:     Mental Status: She is alert and oriented to person, place, and time.      UC Treatments / Results  Labs (all labs ordered  are listed, but only abnormal results are displayed) Labs Reviewed  URINALYSIS, W/ REFLEX TO CULTURE (INFECTION SUSPECTED) - Abnormal; Notable for the following components:      Result Value   Hgb urine dipstick MODERATE (*)    Leukocytes,Ua LARGE (*)    Bacteria, UA FEW (*)    Trichomonas, UA PRESENT (*)    All other components within normal limits  URINE CULTURE    EKG   Radiology No results found.  Procedures Procedures (including critical care time)  Medications Ordered in UC Medications - No data to display  Initial Impression / Assessment and Plan / UC Course  I have reviewed the triage vital signs and the nursing notes.  Pertinent labs & imaging results that were available during my care of the patient were reviewed by me and considered in my medical decision making (see chart for details).  Clinical Course as of 11/29/22 1610  Wed Nov 29, 2022  0921 Trichomonas, UA(!): PRESENT [AW]  [phone] Confirmed with lab technician. Trichomonas entered in error, urine is positive for yeast  [AW]    Clinical Course User Index [AW] Valinda Hoar, NP    Yeast vaginitis  Urine showing yeast, as leukocytes and bacteria present sent for culture, will defer use of antibiotic for culture results, discussed this with patient, prescribed Diflucan and discussed administration, recommended additional supportive measures, may follow-up with his urgent care as needed if symptoms persist or worsen Final Clinical Impressions(s) / UC Diagnoses   Final diagnoses:  None   Discharge Instructions   None    ED Prescriptions   None    PDMP not reviewed this encounter.   Valinda Hoar, [place] 11/29/22 [phone]

## 2022-11-30 LAB — URINE CULTURE: Culture: <10000 — AB

## 2022-12-24 ENCOUNTER — Ambulatory Visit
Admission: EM | Admit: 2022-12-24 | Discharge: 2022-12-24 | Disposition: A | Discharge status: Home / Self Care | Payer: Medicare Other | Attending: Emergency Medicine | Admitting: Emergency Medicine

## 2022-12-24 DIAGNOSIS — B3731 Acute candidiasis of vulva and vagina: Secondary | ICD-10-CM | POA: Diagnosis present

## 2022-12-24 DIAGNOSIS — N3001 Acute cystitis with hematuria: Secondary | ICD-10-CM | POA: Diagnosis present

## 2022-12-24 LAB — WET PREP, GENITAL
Clue Cells Wet Prep HPF POC: NONE SEEN
Sperm: NONE SEEN
Trich, Wet Prep: NONE SEEN
WBC, Wet Prep HPF POC: <10 (ref <10)

## 2022-12-24 LAB — URINALYSIS, MICROSCOPIC (REFLEX)

## 2022-12-24 LAB — URINALYSIS, ROUTINE W REFLEX MICROSCOPIC
Bilirubin Urine: NEGATIVE
Glucose, UA: NEGATIVE mg/dL
Ketones, ur: NEGATIVE mg/dL
Nitrite: NEGATIVE
Protein, ur: NEGATIVE mg/dL
Specific Gravity, Urine: 1.015 (ref 1.005–1.030)
pH: 6.5 (ref 5.0–8.0)

## 2022-12-24 MED ORDER — FLUCONAZOLE 150 MG PO TABS: 150.0000 mg | ORAL_TABLET | ORAL | 0 refills | Status: DC | PRN

## 2022-12-24 MED ORDER — PHENAZOPYRIDINE HCL 200 MG PO TABS: 200.0000 mg | ORAL_TABLET | Freq: Three times a day (TID) | ORAL | 0 refills | Status: DC

## 2022-12-24 MED ORDER — NITROFURANTOIN MONOHYD MACRO 100 MG PO CAPS: 100.0000 mg | ORAL_CAPSULE | Freq: Two times a day (BID) | ORAL | 0 refills | Status: DC

## 2022-12-24 NOTE — Discharge Instructions (Addendum)
You were seen for urinary symptoms and are being treated for urinary tract infection and yeast infection.   - Take the antibiotics as prescribed until they're finished. If you think you're having a reaction, stop the medication, take benadryl and go to the nearest urgent care/emergency room. Take a probiotic while taking the antibiotic to decrease the chances of stomach upset.  -You are also being prescribed Pyridium.  This is to help with your UTI symptoms.  It will turn your urine a bright red-orange color.  You are only to take these for 2 days while we work from antibiotic to kick in. - We are sending your urine out for a culture. If we need to add or change any medications, our nurse will give you a call to let you know. -Follow-up with your primary care provider and your kidney doctor to let them know of your recent diagnosis and treatment. -Increase water intake.  Take care, Dr. Sharlet Salina, NP-c

## 2022-12-24 NOTE — ED Triage Notes (Signed)
Started with "back pain around the first of the month, radiation to left side/flank". Some vaginal pain "feel's like something is sticking in me and rectum hurts too". Dysuria "a lot". Some blood seen in urine "but I always have it" and urine is darker. Stools "sometimes normal, with a lot of constipation'. No fever.

## 2022-12-24 NOTE — ED Notes (Addendum)
Patient educated on how to self swab at this time. Patient ambulatory to bathroom to swab.

## 2022-12-24 NOTE — ED Provider Notes (Signed)
[facility] [place]   Name: Kruz Abonce DOB: [birth year] MRN: [id number] CSN: [id number] PCP: Marina Goodell, MD  Arrival date and time:  12/24/22 1017  Chief Complaint:  UTI Symptoms   NOTE: Prior to seeing the patient today, I have reviewed the triage nursing documentation and vital signs. Clinical staff has updated patient's PMH/PSHx, current medication list, and drug allergies/intolerances to ensure comprehensive history available to assist in medical decision making.   History:   HPI: Karine Wandra Kazemi is a 82 y.o. female who presents today with complaints of urinary symptoms.  This is a chronic problem with an acute flare.  Patient states the symptoms have been ongoing for 3 months but have worsened over the past 24 hours.  She endorses burning with urination, abnormal odor, and discharge.  She denies fevers or chills, but she does endorse general malaise.  She also endorses some stomach pain that radiates to her lower back.  She has a nephrologist that she sees; next appointment is in June 5.  She was treated for yeast vaginitis in April 17 at this facility and kidney stones on January 19 in this facility.   Past Medical History:  Diagnosis Date   Anemia    COVID-19 04/2021   Depression    Diabetes mellitus without complication (HCC)    GERD (gastroesophageal reflux disease)    Head ache    Hypertension    Osteoarthritis    Stroke [facility])    TIA   TIA (transient ischemic attack)     Past Surgical History:  Procedure Laterality Date   ABDOMINAL HYSTERECTOMY     CATARACT EXTRACTION Bilateral    COLONOSCOPY     COLONOSCOPY WITH PROPOFOL N/A 11/01/2017   Procedure: COLONOSCOPY WITH PROPOFOL;  Surgeon: Christena Deem, MD;  Location: [facility] ENDOSCOPY;  Service: Endoscopy;  Laterality: N/A;   COLONOSCOPY WITH PROPOFOL N/A 01/03/2021   Procedure: COLONOSCOPY WITH PROPOFOL;  Surgeon: Regis Bill, MD;  Location: [facility] ENDOSCOPY;  Service:  Endoscopy;  Laterality: N/A;   ESOPHAGOGASTRODUODENOSCOPY     ESOPHAGOGASTRODUODENOSCOPY (EGD) WITH PROPOFOL N/A 06/29/2016   Procedure: ESOPHAGOGASTRODUODENOSCOPY (EGD) WITH PROPOFOL;  Surgeon: Christena Deem, MD;  Location: [facility] ENDOSCOPY;  Service: Endoscopy;  Laterality: N/A;   ESOPHAGOGASTRODUODENOSCOPY (EGD) WITH PROPOFOL N/A 11/01/2017   Procedure: ESOPHAGOGASTRODUODENOSCOPY (EGD) WITH PROPOFOL;  Surgeon: Christena Deem, MD;  Location: [facility] ENDOSCOPY;  Service: Endoscopy;  Laterality: N/A;   ESOPHAGOGASTRODUODENOSCOPY (EGD) WITH PROPOFOL N/A 01/03/2021   Procedure: ESOPHAGOGASTRODUODENOSCOPY (EGD) WITH PROPOFOL;  Surgeon: Regis Bill, MD;  Location: [facility] ENDOSCOPY;  Service: Endoscopy;  Laterality: N/A;   ROTATOR CUFF REPAIR     TUBAL LIGATION      Family History  Problem Relation Age of Onset   Breast cancer Sister 61    Social History   Tobacco Use   Smoking status: Never   Smokeless tobacco: Never  Vaping Use   Vaping Use: Never used  Substance Use Topics   Alcohol use: No    Alcohol/week: 1.0 standard drink of alcohol    Types: 1 Cans of beer per week    Comment: occassion   Drug use: No    Patient Active Problem List   Diagnosis Date Noted   COVID-19 04/2021   Acute pain of right knee 01/22/2018   Osteopenia 09/11/2017   Presbyesophagus 09/11/2017   Primary osteoarthritis of right knee 10/26/2016   Other constipation 06/19/2016   Hypercholesterolemia 06/08/2016   New onset type  2 diabetes mellitus (HCC) 06/08/2016   Vitamin B 12 deficiency 06/08/2016   Recurrent major depressive disorder (HCC) 04/14/2016   Headache 09/20/2015   Depression 01/07/2014   GERD (gastroesophageal reflux disease) 01/07/2014    Home Medications:    Current Meds  Medication Sig   ACCU-CHEK AVIVA PLUS test strip 1 each 3 (three) times daily. Last check, 12-23-2022, HS-Result 202   Alcohol Swabs (ALCOHOL PREP) 70 % PADS Use to check blood sugar twice daily.    amoxicillin-clavulanate (AUGMENTIN) 875-125 MG tablet Take 1 tablet by mouth 2 (two) times daily.   ascorbic acid (VITAMIN C) 250 MG CHEW Chew 250 mg by mouth daily.   aspirin 81 MG chewable tablet Chew by mouth.   aspirin 81 MG tablet Take 81 mg by mouth daily.   azelastine (ASTELIN) 0.1 % nasal spray 1 spray into each nostril two (2) times a day. Use in each nostril as directed   blood glucose meter kit and supplies See admin instructions.   cefdinir (OMNICEF) 300 MG capsule Take 300 mg by mouth every 12 (twelve) hours.   cholecalciferol (VITAMIN D) 1000 units tablet Take 1,000 Units by mouth 2 (two) times daily.    Cholecalciferol (VITAMIN D3) 25 MCG (1000 UT) CAPS Take by mouth.   ciprofloxacin (CIPRO) 500 MG tablet Take 500 mg by mouth 2 (two) times daily.   cyanocobalamin (VITAMIN B12) 1000 MCG tablet Take 1 tablet by mouth daily.   doxycycline (VIBRAMYCIN) 100 MG capsule Take by mouth.   glipiZIDE (GLUCOTROL) 5 MG tablet 2 tabs po qam and 1 tab po qpm   Lancets (ONETOUCH DELICA PLUS LANCET30G) MISC    Lancets MISC 2 (two) times daily.   methylPREDNISolone (MEDROL DOSEPAK) 4 MG TBPK tablet See admin instructions.   Milk Thistle Extract 175 MG TABS Take by mouth.   mometasone (NASONEX) 50 MCG/ACT nasal spray 2 sprays 2 (two) times daily.   pantoprazole (PROTONIX) 20 MG tablet Take by mouth.   predniSONE (DELTASONE) 5 MG tablet Take by mouth.   rosuvastatin (CRESTOR) 5 MG tablet Take 5 mg by mouth daily.   rosuvastatin (CRESTOR) 5 MG tablet Take 1 tablet by mouth daily.   solifenacin (VESICARE) 10 MG tablet Take 10 mg by mouth daily.   solifenacin (VESICARE) 5 MG tablet Take 1 tablet by mouth daily.   trimethoprim (TRIMPEX) 100 MG tablet 1 tablet daily.    Allergies:   Bupropion, Citalopram, Simvastatin, Esomeprazole magnesium, Metformin and related, Pantoprazole, and Prozac [fluoxetine]  Review of Systems (ROS): Review of Systems  Constitutional:  Negative for chills, fatigue and  fever.  Gastrointestinal:  Positive for abdominal pain. Negative for diarrhea and vomiting.  Genitourinary:  Positive for dysuria, flank pain, frequency and pelvic pain. Negative for difficulty urinating.  Skin:  Negative for color change.  All other systems reviewed and are negative.    Vital Signs: Today's Vitals   12/24/22 1044 12/24/22 1051  BP:  135/79  Pulse:  74  Resp:  18  Temp:  98.3 F (36.8 C)  TempSrc:  Oral  SpO2:  97%  Weight: 190 lb (86.2 kg)   Height: 5\' 9"  (1.753 m)   PainSc: 9      Physical Exam: Physical Exam Vitals and nursing note reviewed.  Constitutional:      Appearance: Normal appearance.  Cardiovascular:     Rate and Rhythm: Normal rate and regular rhythm.     Pulses: Normal pulses.  Pulmonary:     Effort: Pulmonary effort  is normal.     Breath sounds: Normal breath sounds.  Abdominal:     Palpations: Abdomen is soft.     Tenderness: There is abdominal tenderness in the right lower quadrant, suprapubic area and left lower quadrant. There is left CVA tenderness.  Skin:    General: Skin is warm and dry.  Neurological:     Mental Status: She is alert.      Urgent Care Treatments / Results:   LABS: PLEASE NOTE: all labs that were ordered this encounter are listed, however only abnormal results are displayed. Labs Reviewed  WET PREP, GENITAL - Abnormal; Notable for the following components:      Result Value   Yeast Wet Prep HPF POC PRESENT (*)    All other components within normal limits  URINALYSIS, ROUTINE W REFLEX MICROSCOPIC - Abnormal; Notable for the following components:   Hgb urine dipstick MODERATE (*)    Leukocytes,Ua SMALL (*)    All other components within normal limits  URINALYSIS, MICROSCOPIC (REFLEX) - Abnormal; Notable for the following components:   Bacteria, UA FEW (*)    All other components within normal limits    EKG: -None  RADIOLOGY: No results found.  PROCEDURES: Procedures  MEDICATIONS RECEIVED THIS  VISIT: Medications - No data to display  PERTINENT CLINICAL COURSE NOTES/UPDATES:   Initial Impression / Assessment and Plan / Urgent Care Course:  Pertinent labs & imaging results that were available during my care of the patient were personally reviewed by me and considered in my medical decision making (see lab/imaging section of note for values and interpretations).  Favor Torra Baune is a 82 y.o. female who presents to [facility] today with complaints of urinary discomfort, diagnosed with yeast vaginitis and urinary tract infection, and treated as such with the medications below. NP and patient reviewed discharge instructions below during visit.   Patient is well appearing overall in clinic today. She does not appear to be in any acute distress. Presenting symptoms (see HPI) and exam as documented above.   I have reviewed the follow up and strict return precautions for any new or worsening symptoms. Patient is aware of symptoms that would be deemed urgent/emergent, and would thus require further evaluation either here or in the emergency department. At the time of discharge, she verbalized understanding and consent with the discharge plan as it was reviewed with her. All questions were fielded by provider and/or clinic staff prior to patient discharge.    Final Clinical Impressions / Urgent Care Diagnoses:   Final diagnoses:  Yeast vaginitis  Acute cystitis with hematuria    New Prescriptions:   Controlled Substance Registry consulted? Not Applicable  Meds ordered this encounter  Medications   fluconazole (DIFLUCAN) 150 MG tablet    Sig: Take 1 tablet (150 mg total) by mouth every 3 (three) days as needed.    Dispense:  2 tablet    Refill:  0   nitrofurantoin, macrocrystal-monohydrate, (MACROBID) 100 MG capsule    Sig: Take 1 capsule (100 mg total) by mouth 2 (two) times daily.    Dispense:  10 capsule    Refill:  0   phenazopyridine (PYRIDIUM) 200 MG tablet    Sig:  Take 1 tablet (200 mg total) by mouth 3 (three) times daily.    Dispense:  6 tablet    Refill:  0      Discharge Instructions      You were seen for urinary symptoms and are being treated  for urinary tract infection and yeast infection.   - Take the antibiotics as prescribed until they're finished. If you think you're having a reaction, stop the medication, take benadryl and go to the nearest urgent care/emergency room. Take a probiotic while taking the antibiotic to decrease the chances of stomach upset.  -You are also being prescribed Pyridium.  This is to help with your UTI symptoms.  It will turn your urine a bright red-orange color.  You are only to take these for 2 days while we work from antibiotic to kick in. - We are sending your urine out for a culture. If we need to add or change any medications, our nurse will give you a call to let you know. -Follow-up with your primary care provider and your kidney doctor to let them know of your recent diagnosis and treatment. -Increase water intake.  Take care, Dr. Sharlet Salina, NP-c     Recommended Follow up Care:  Patient encouraged to follow up with the following provider within the specified time frame, or sooner as dictated by the severity of her symptoms. As always, she was instructed that for any urgent/emergent care needs, she should seek care either here or in the emergency department for more immediate evaluation.   Bailey Mech, DNP, NP-c   Bailey Mech, NP 12/24/22 1152

## 2022-12-25 LAB — URINE CULTURE: Culture: NO GROWTH

## 2023-05-01 ENCOUNTER — Ambulatory Visit
Admission: EM | Admit: 2023-05-01 | Discharge: 2023-05-01 | Disposition: A | Discharge status: Home / Self Care | Payer: Medicare Other | Attending: Emergency Medicine | Admitting: Emergency Medicine

## 2023-05-01 DIAGNOSIS — Z1152 Encounter for screening for COVID-19: Secondary | ICD-10-CM | POA: Diagnosis not present

## 2023-05-01 DIAGNOSIS — B349 Viral infection, unspecified: Secondary | ICD-10-CM

## 2023-05-01 DIAGNOSIS — Z77120 Contact with and (suspected) exposure to mold (toxic): Secondary | ICD-10-CM

## 2023-05-01 LAB — SARS CORONAVIRUS 2 BY RT PCR: SARS Coronavirus 2 by RT PCR: NEGATIVE

## 2023-05-01 NOTE — Discharge Instructions (Addendum)
Follow up with environmental division of local health department regarding mold in home concerns.  Your covid test is negative. Please finish the antibiotic you are on,follow up with PCP. Return as needed.

## 2023-05-01 NOTE — ED Triage Notes (Signed)
Patient presents to UC for HA, N/V, and stuff nose x 3 days. States exposed to mold 2 days ago in her home. Req covid testing. Treating symptoms with tylenol.   Denies fever.

## 2023-05-01 NOTE — ED Provider Notes (Signed)
[facility]    CSN: [id number] Arrival date & time: 05/01/23  7829      History   Chief Complaint Chief Complaint  Patient presents with   Nausea   Emesis   Nasal Congestion    HPI Vicki Norris is a 82 y.o. female.   82 year old female pt, Vicki Norris, presents to urgent care for headache,nausea, vomiting, stuffy nose x 3 days. Recent exposure to mold in home 2 days prior. Pt recently saw PCP and was placed on amoxicillin:is still taking it.   The history is provided by the patient. No language interpreter was used.    Past Medical History:  Diagnosis Date   Anemia    COVID-19 04/2021   Depression    Diabetes mellitus without complication (HCC)    GERD (gastroesophageal reflux disease)    Head ache    Hypertension    Osteoarthritis    Stroke [facility])    TIA   TIA (transient ischemic attack)     Patient Active Problem List   Diagnosis Date Noted   Nonspecific syndrome suggestive of viral illness 05/01/2023   Suspected exposure to mold 05/01/2023   COVID-19 04/2021   Acute pain of right knee 01/22/2018   Osteopenia 09/11/2017   Presbyesophagus 09/11/2017   Primary osteoarthritis of right knee 10/26/2016   Other constipation 06/19/2016   Hypercholesterolemia 06/08/2016   New onset type 2 diabetes mellitus (HCC) 06/08/2016   Vitamin B 12 deficiency 06/08/2016   Recurrent major depressive disorder (HCC) 04/14/2016   Headache 09/20/2015   Depression 01/07/2014   GERD (gastroesophageal reflux disease) 01/07/2014    Past Surgical History:  Procedure Laterality Date   ABDOMINAL HYSTERECTOMY     CATARACT EXTRACTION Bilateral    COLONOSCOPY     COLONOSCOPY WITH PROPOFOL N/A 11/01/2017   Procedure: COLONOSCOPY WITH PROPOFOL;  Surgeon: Christena Deem, MD;  Location: [facility] ENDOSCOPY;  Service: Endoscopy;  Laterality: N/A;   COLONOSCOPY WITH PROPOFOL N/A 01/03/2021   Procedure: COLONOSCOPY WITH PROPOFOL;  Surgeon: Regis Bill, MD;   Location: [facility] ENDOSCOPY;  Service: Endoscopy;  Laterality: N/A;   ESOPHAGOGASTRODUODENOSCOPY     ESOPHAGOGASTRODUODENOSCOPY (EGD) WITH PROPOFOL N/A 06/29/2016   Procedure: ESOPHAGOGASTRODUODENOSCOPY (EGD) WITH PROPOFOL;  Surgeon: Christena Deem, MD;  Location[facility] ENDOSCOPY;  Service: Endoscopy;  Laterality: N/A;   ESOPHAGOGASTRODUODENOSCOPY (EGD) WITH PROPOFOL N/A 11/01/2017   Procedure: ESOPHAGOGASTRODUODENOSCOPY (EGD) WITH PROPOFOL;  Surgeon: Christena Deem, MD;  Location: [facility] ENDOSCOPY;  Service: Endoscopy;  Laterality: N/A;   ESOPHAGOGASTRODUODENOSCOPY (EGD) WITH PROPOFOL N/A 01/03/2021   Procedure: ESOPHAGOGASTRODUODENOSCOPY (EGD) WITH PROPOFOL;  Surgeon: Regis Bill, MD;  Location: [facility] ENDOSCOPY;  Service: Endoscopy;  Laterality: N/A;   ROTATOR CUFF REPAIR     TUBAL LIGATION      OB History   No obstetric history on file.      Home Medications    Prior to Admission medications   Medication Sig Start Date End Date Taking? Authorizing Provider  ACCU-CHEK AVIVA PLUS test strip 1 each 3 (three) times daily. Last check, 12-23-2022, HS-Result 202 10/24/20   [provider]  Alcohol Swabs (ALCOHOL PREP) 70 % PADS Use to check blood sugar twice daily. 12/14/21   [provider]  aspirin 81 MG chewable tablet Chew by mouth.    [provider]  aspirin 81 MG tablet Take 81 mg by mouth daily.    [provider]  azelastine (ASTELIN) 0.1 % nasal spray Place into the nose. 11/08/20  11/08/21  [provider]  azelastine (ASTELIN) 0.1 % nasal spray 1 spray into each nostril two (2) times a day. Use in each nostril as directed 08/19/22 08/19/23  [provider]  cetirizine-pseudoephedrine (ZYRTEC-D) 5-120 MG tablet Take 1 tablet by mouth daily. 09/06/19   Verlee Monte, NP  chlorpheniramine-HYDROcodone Stevphen Meuse) 10-8 MG/5ML Take by mouth. 07/26/22   [provider]  cholecalciferol (VITAMIN D) 1000 units tablet Take 1,000  Units by mouth 2 (two) times daily.     [provider]  Cholecalciferol (VITAMIN D3) 25 MCG (1000 UT) CAPS Take by mouth.    [provider]  cyanocobalamin (VITAMIN B12) 1000 MCG tablet Take 1 tablet by mouth daily.    [provider]  cyanocobalamin 1000 MCG tablet Take 1,000 mcg by mouth daily.    [provider]  famotidine (PEPCID) 40 MG tablet TAKE 1 TABLET BY MOUTH EVERYDAY AT BEDTIME 04/12/22   [provider]  fluconazole (DIFLUCAN) 150 MG tablet Take 1 tablet (150 mg total) by mouth every 3 (three) days as needed. 12/24/22   Bailey Mech, NP  fluticasone (FLONASE) 50 MCG/ACT nasal spray Place 1 spray into both nostrils daily. 07/13/22   Candis Schatz, PA-C  glipiZIDE (GLUCOTROL XL) 5 MG 24 hr tablet Take 5 mg by mouth daily. 11/19/20   [provider]  glipiZIDE (GLUCOTROL) 10 MG tablet Take by mouth.    [provider]  glipiZIDE (GLUCOTROL) 5 MG tablet 2 tabs po qam and 1 tab po qpm 12/05/22   [provider]  guaiFENesin-dextromethorphan (ROBITUSSIN DM) 100-10 MG/5ML syrup Take 5 mLs by mouth every 4 (four) hours as needed for cough. 07/13/22   Candis Schatz, PA-C  ipratropium (ATROVENT) 0.03 % nasal spray Place 2 sprays into both nostrils every 12 (twelve) hours. 09/01/22   Valinda Hoar, NP  lactulose (CHRONULAC) 10 GM/15ML solution SMARTSIG:30 Milliliter(s) By Mouth Daily PRN 10/23/22   [provider]  Lancets [facility] [place] PLUS [place]) MISC  10/26/22   [provider]  Lancets MISC 2 (two) times daily. 12/14/21   [provider]  lansoprazole (PREVACID) 30 MG capsule Take 30 mg by mouth daily. 05/20/20   [provider]  meloxicam (MOBIC) 7.5 MG tablet Take 1 tablet (7.5 mg total) by mouth daily. 09/01/22   White, Elita Boone, NP  methylPREDNISolone (MEDROL DOSEPAK) 4 MG TBPK tablet See admin instructions. 08/19/22   [provider]  Milk Thistle Extract 175  MG TABS Take by mouth.    [provider]  mometasone (NASONEX) 50 MCG/ACT nasal spray 2 sprays 2 (two) times daily. 09/18/22   [provider]  Multiple Vitamins-Minerals (MULTIVITAMIN WITH MINERALS) tablet Take 1 tablet by mouth daily.    [provider]  nitrofurantoin, macrocrystal-monohydrate, (MACROBID) 100 MG capsule Take 1 capsule (100 mg total) by mouth 2 (two) times daily. 12/24/22   Bailey Mech, NP  ondansetron (ZOFRAN) 4 MG tablet Take 4 mg by mouth every 8 (eight) hours as needed. 11/16/20   [provider]  ondansetron (ZOFRAN-ODT) 4 MG disintegrating tablet Take 1 tablet (4 mg total) by mouth every 8 (eight) hours as needed for nausea or vomiting. 09/01/22   White, Elita Boone, NP  pantoprazole (PROTONIX) 20 MG tablet Take by mouth. 12/13/22 03/13/23  [provider]  phenazopyridine (PYRIDIUM) 200 MG tablet Take 1 tablet (200 mg total) by mouth 3 (three) times daily. 12/24/22   Bailey Mech, NP  predniSONE (DELTASONE) 5  MG tablet Take by mouth. 07/17/22   [provider]  rosuvastatin (CRESTOR) 5 MG tablet Take 5 mg by mouth daily.    [provider]  rosuvastatin (CRESTOR) 5 MG tablet Take 1 tablet by mouth daily. 11/20/22   [provider]  solifenacin (VESICARE) 10 MG tablet Take 10 mg by mouth daily. 11/09/22   [provider]  solifenacin (VESICARE) 5 MG tablet Take 1 tablet by mouth daily. 04/25/22   [provider]  tamsulosin (FLOMAX) 0.4 MG CAPS capsule Take 1 capsule (0.4 mg total) by mouth daily. 09/01/22   Valinda Hoar, NP  Esomeprazole Magnesium (NEXIUM PO) Take 22.3 mg by mouth.  09/06/19  [provider]  sodium chloride (OCEAN) 0.65 % SOLN nasal spray Place 2 sprays into both nostrils every 2 (two) hours while awake. 09/12/15 09/06/19  Betancourt, Jarold Song, NP    Family History Family History  Problem Relation Age of Onset   Breast cancer Sister 65    Social  History Social History   Tobacco Use   Smoking status: Never   Smokeless tobacco: Never  Vaping Use   Vaping status: Never Used  Substance Use Topics   Alcohol use: No    Alcohol/week: 1.0 standard drink of alcohol    Types: 1 Cans of beer per week    Comment: occassion   Drug use: No     Allergies   Bupropion, Citalopram, Simvastatin, Esomeprazole magnesium, Metformin and related, Pantoprazole, and Prozac [fluoxetine]   Review of Systems Review of Systems  Constitutional:  Negative for fever.  HENT:  Positive for congestion.   Gastrointestinal:  Positive for nausea and vomiting.  Neurological:  Positive for headaches.  All other systems reviewed and are negative.    Physical Exam Triage Vital Signs ED Triage Vitals  Encounter Vitals Group     BP 05/01/23 1102 125/80     Systolic BP Percentile --      Diastolic BP Percentile --      Pulse Rate 05/01/23 1102 85     Resp 05/01/23 1102 16     Temp 05/01/23 1102 98.5 F (36.9 C)     Temp Source 05/01/23 1102 Oral     SpO2 05/01/23 1102 95 %     Weight --      Height --      Head Circumference --      Peak Flow --      Pain Score 05/01/23 1100 9     Pain Loc --      Pain Education --      Exclude from Growth Chart --    No data found.  Updated Vital Signs BP 125/80 (BP Location: Left Arm)   Pulse 85   Temp 98.5 F (36.9 C) (Oral)   Resp 16   SpO2 95%   Visual Acuity Right Eye Distance:   Left Eye Distance:   Bilateral Distance:    Right Eye Near:   Left Eye Near:    Bilateral Near:     Physical Exam Vitals and nursing note reviewed.  Constitutional:      General: She is not in acute distress.    Appearance: She is well-developed.  HENT:     Head: Normocephalic.     Right Ear: Tympanic membrane is retracted.     Left Ear: Tympanic membrane is retracted.     Nose: Mucosal edema and congestion present.     Right Sinus: No maxillary sinus tenderness or  frontal sinus tenderness.     Left Sinus:  No maxillary sinus tenderness or frontal sinus tenderness.     Mouth/Throat:     Lips: Pink.     Mouth: Mucous membranes are moist.     Pharynx: Oropharynx is clear.  Eyes:     General: Lids are normal.     Conjunctiva/sclera: Conjunctivae normal.     Pupils: Pupils are equal, round, and reactive to light.  Neck:     Trachea: No tracheal deviation.  Cardiovascular:     Rate and Rhythm: Normal rate and regular rhythm.     Pulses: Normal pulses.     Heart sounds: Normal heart sounds. No murmur heard. Pulmonary:     Effort: Pulmonary effort is normal.     Breath sounds: Normal breath sounds and air entry.  Abdominal:     General: Bowel sounds are normal.     Palpations: Abdomen is soft.     Tenderness: There is no abdominal tenderness.  Musculoskeletal:        General: Normal range of motion.     Cervical back: Normal range of motion.  Lymphadenopathy:     Cervical: No cervical adenopathy.  Skin:    General: Skin is warm and dry.     Findings: No rash.  Neurological:     General: No focal deficit present.     Mental Status: She is alert and oriented to person, place, and time.     GCS: GCS eye subscore is 4. GCS verbal subscore is 5. GCS motor subscore is 6.  Psychiatric:        Attention and Perception: Attention normal.        Mood and Affect: Mood normal.        Speech: Speech normal.        Behavior: Behavior normal. Behavior is cooperative.      UC Treatments / Results  Labs (all labs ordered are listed, but only abnormal results are displayed) Labs Reviewed  SARS CORONAVIRUS 2 BY RT PCR    EKG   Radiology No results found.  Procedures Procedures (including critical care time)  Medications Ordered in UC Medications - No data to display  Initial Impression / Assessment and Plan / UC Course  I have reviewed the triage vital signs and the nursing notes.  Pertinent labs & imaging results that were available during my care of the patient were reviewed by  me and considered in my medical decision making (see chart for details).  Clinical Course as of 05/01/23 1201  Tue May 01, 2023  1144 Covid test is negative. [JD]    Clinical Course User Index [JD] Everlean Bucher, Para March, NP    Ddx: Viral illness, mold exposure, allergies Final Clinical Impressions(s) / UC Diagnoses   Final diagnoses:  Nonspecific syndrome suggestive of viral illness  Suspected exposure to mold     Discharge Instructions      Follow up with environmental division of local health department regarding mold in home concerns.  Your covid test is negative. Please finish the antibiotic you are on,follow up with PCP. Return as needed.     ED Prescriptions   None    PDMP not reviewed this encounter.   Clancy Gourd, NP 05/01/23 1201

## 2023-05-16 ENCOUNTER — Ambulatory Visit
Admission: EM | Admit: 2023-05-16 | Discharge: 2023-05-16 | Disposition: A | Discharge status: Home / Self Care | Payer: Medicare Other

## 2023-05-16 DIAGNOSIS — Z7984 Long term (current) use of oral hypoglycemic drugs: Secondary | ICD-10-CM | POA: Diagnosis not present

## 2023-05-16 DIAGNOSIS — R109 Unspecified abdominal pain: Secondary | ICD-10-CM | POA: Diagnosis not present

## 2023-05-16 DIAGNOSIS — B9689 Other specified bacterial agents as the cause of diseases classified elsewhere: Secondary | ICD-10-CM | POA: Insufficient documentation

## 2023-05-16 DIAGNOSIS — N390 Urinary tract infection, site not specified: Secondary | ICD-10-CM | POA: Diagnosis present

## 2023-05-16 DIAGNOSIS — Z87440 Personal history of urinary (tract) infections: Secondary | ICD-10-CM | POA: Diagnosis not present

## 2023-05-16 DIAGNOSIS — E119 Type 2 diabetes mellitus without complications: Secondary | ICD-10-CM | POA: Insufficient documentation

## 2023-05-16 DIAGNOSIS — R300 Dysuria: Secondary | ICD-10-CM

## 2023-05-16 LAB — URINALYSIS, W/ REFLEX TO CULTURE (INFECTION SUSPECTED)
Bilirubin Urine: NEGATIVE
Glucose, UA: NEGATIVE mg/dL
Ketones, ur: NEGATIVE mg/dL
Nitrite: NEGATIVE
Protein, ur: NEGATIVE mg/dL
Specific Gravity, Urine: 1.015 (ref 1.005–1.030)
pH: 6.5 (ref 5.0–8.0)

## 2023-05-16 MED ORDER — CIPROFLOXACIN HCL 500 MG PO TABS: 500.0000 mg | ORAL_TABLET | Freq: Two times a day (BID) | ORAL | 0 refills | Status: AC

## 2023-05-16 NOTE — ED Triage Notes (Signed)
Pt c/o urinary freq,burning & pain x1 wk. Denies any hematuria. Was currently on amoxi due to sinus infection but stopped it.

## 2023-05-16 NOTE — ED Provider Notes (Signed)
[facility]    CSN: [id number] Arrival date & time: 05/16/23  1110      History   Chief Complaint Chief Complaint  Patient presents with   Dysuria    HPI Vicki Norris is a 82 y.o. female presenting for approximately 1 week history of dysuria, frequency and urgency.  Also reports left flank pain. Denies fever, fatigue, nausea/vomiting, chills, abdominal pain, vaginal discharge/itching or odor.  Patient believes she has a UTI. History of recurrent UTIs and has upcoming appointment with urology later this month. She was recently placed on Augmentin about a week ago for sinusitis, but stopped taking it 2 days ago.  HPI  Past Medical History:  Diagnosis Date   Anemia    COVID-19 04/2021   Depression    Diabetes mellitus without complication (HCC)    GERD (gastroesophageal reflux disease)    Head ache    Hypertension    Osteoarthritis    Stroke [facility])    TIA   TIA (transient ischemic attack)     Patient Active Problem List   Diagnosis Date Noted   Nonspecific syndrome suggestive of viral illness 05/01/2023   Suspected exposure to mold 05/01/2023   COVID-19 04/2021   Acute pain of right knee 01/22/2018   Osteopenia 09/11/2017   Presbyesophagus 09/11/2017   Primary osteoarthritis of right knee 10/26/2016   Other constipation 06/19/2016   Hypercholesterolemia 06/08/2016   New onset type 2 diabetes mellitus (HCC) 06/08/2016   Vitamin B 12 deficiency 06/08/2016   Recurrent major depressive disorder (HCC) 04/14/2016   Headache 09/20/2015   Depression 01/07/2014   GERD (gastroesophageal reflux disease) 01/07/2014    Past Surgical History:  Procedure Laterality Date   ABDOMINAL HYSTERECTOMY     CATARACT EXTRACTION Bilateral    COLONOSCOPY     COLONOSCOPY WITH PROPOFOL N/A 11/01/2017   Procedure: COLONOSCOPY WITH PROPOFOL;  Surgeon: Christena Deem, MD;  Location: [facility] ENDOSCOPY;  Service: Endoscopy;  Laterality: N/A;   COLONOSCOPY WITH PROPOFOL N/A  01/03/2021   Procedure: COLONOSCOPY WITH PROPOFOL;  Surgeon: Regis Bill, MD;  Location: [facility] ENDOSCOPY;  Service: Endoscopy;  Laterality: N/A;   ESOPHAGOGASTRODUODENOSCOPY     ESOPHAGOGASTRODUODENOSCOPY (EGD) WITH PROPOFOL N/A 06/29/2016   Procedure: ESOPHAGOGASTRODUODENOSCOPY (EGD) WITH PROPOFOL;  Surgeon: Christena Deem, MD;  Location: [facility] ENDOSCOPY;  Service: Endoscopy;  Laterality: N/A;   ESOPHAGOGASTRODUODENOSCOPY (EGD) WITH PROPOFOL N/A 11/01/2017   Procedure: ESOPHAGOGASTRODUODENOSCOPY (EGD) WITH PROPOFOL;  Surgeon: Christena Deem, MD;  Location: [facility] ENDOSCOPY;  Service: Endoscopy;  Laterality: N/A;   ESOPHAGOGASTRODUODENOSCOPY (EGD) WITH PROPOFOL N/A 01/03/2021   Procedure: ESOPHAGOGASTRODUODENOSCOPY (EGD) WITH PROPOFOL;  Surgeon: Regis Bill, MD;  Location: [facility] ENDOSCOPY;  Service: Endoscopy;  Laterality: N/A;   ROTATOR CUFF REPAIR     TUBAL LIGATION      OB History   No obstetric history on file.      Home Medications    Prior to Admission medications   Medication Sig Start Date End Date Taking? Authorizing Provider  ACCU-CHEK AVIVA PLUS test strip 1 each 3 (three) times daily. Last check, 12-23-2022, HS-Result 202 10/24/20  Yes [provider]  Alcohol Swabs (ALCOHOL PREP) 70 % PADS Use to check blood sugar twice daily. 12/14/21  Yes [provider]  aspirin 81 MG chewable tablet Chew by mouth.   Yes [provider]  aspirin 81 MG tablet Take 81 mg by mouth daily.   Yes [provider]  azelastine (ASTELIN) 0.1 % nasal spray 1  spray into each nostril two (2) times a day. Use in each nostril as directed 08/19/22 08/19/23 Yes [provider]  cetirizine-pseudoephedrine (ZYRTEC-D) 5-120 MG tablet Take 1 tablet by mouth daily. 09/06/19  Yes Verlee Monte, NP  chlorpheniramine-HYDROcodone (TUSSIONEX) 10-8 MG/5ML Take by mouth. 07/26/22  Yes [provider]  cholecalciferol (VITAMIN D) 1000 units tablet Take  1,000 Units by mouth 2 (two) times daily.    Yes [provider]  Cholecalciferol (VITAMIN D3) 25 MCG (1000 UT) CAPS Take by mouth.   Yes [provider]  ciprofloxacin (CIPRO) 500 MG tablet Take 1 tablet (500 mg total) by mouth every 12 (twelve) hours for 7 days. 05/16/23 05/23/23 Yes Eusebio Friendly B, PA-C  cyanocobalamin (VITAMIN B12) 1000 MCG tablet Take 1 tablet by mouth daily.   Yes [provider]  cyanocobalamin 1000 MCG tablet Take 1,000 mcg by mouth daily.   Yes [provider]  famotidine (PEPCID) 40 MG tablet TAKE 1 TABLET BY MOUTH EVERYDAY AT BEDTIME 04/12/22  Yes [provider]  fluconazole (DIFLUCAN) 150 MG tablet Take 1 tablet (150 mg total) by mouth every 3 (three) days as needed. 12/24/22  Yes Bailey Mech, NP  fluticasone (FLONASE) 50 MCG/ACT nasal spray Place 1 spray into both nostrils daily. 07/13/22  Yes Candis Schatz, PA-C  glipiZIDE (GLUCOTROL XL) 5 MG 24 hr tablet Take 5 mg by mouth daily. 11/19/20  Yes [provider]  glipiZIDE (GLUCOTROL) 10 MG tablet Take by mouth.   Yes [provider]  glipiZIDE (GLUCOTROL) 5 MG tablet 2 tabs po qam and 1 tab po qpm 12/05/22  Yes [provider]  guaiFENesin-dextromethorphan (ROBITUSSIN DM) 100-10 MG/5ML syrup Take 5 mLs by mouth every 4 (four) hours as needed for cough. 07/13/22  Yes Candis Schatz, PA-C  ipratropium (ATROVENT) 0.03 % nasal spray Place 2 sprays into both nostrils every 12 (twelve) hours. 09/01/22  Yes Valinda Hoar, NP  lactulose (CHRONULAC) 10 GM/15ML solution SMARTSIG:30 Milliliter(s) By Mouth Daily PRN 10/23/22  Yes [provider]  Lancets (ONETOUCH DELICA PLUS LANCET30G) MISC  10/26/22  Yes [provider]  Lancets MISC 2 (two) times daily. 12/14/21  Yes [provider]  lansoprazole (PREVACID) 30 MG capsule Take 30 mg by mouth daily. 05/20/20  Yes [provider]  meloxicam (MOBIC) 7.5 MG tablet Take 1  tablet (7.5 mg total) by mouth daily. 09/01/22  Yes White, Adrienne R, NP  methylPREDNISolone (MEDROL DOSEPAK) 4 MG TBPK tablet See admin instructions. 08/19/22  Yes [provider]  Milk Thistle Extract 175 MG TABS Take by mouth.   Yes [provider]  mometasone (NASONEX) 50 MCG/ACT nasal spray 2 sprays 2 (two) times daily. 09/18/22  Yes [provider]  Multiple Vitamins-Minerals (MULTIVITAMIN WITH MINERALS) tablet Take 1 tablet by mouth daily.   Yes [provider]  nitrofurantoin, macrocrystal-monohydrate, (MACROBID) 100 MG capsule Take 1 capsule (100 mg total) by mouth 2 (two) times daily. 12/24/22  Yes Bailey Mech, NP  ondansetron (ZOFRAN) 4 MG tablet Take 4 mg by mouth every 8 (eight) hours as needed. 11/16/20  Yes [provider]  ondansetron (ZOFRAN-ODT) 4 MG disintegrating tablet Take 1 tablet (4 mg total) by mouth every 8 (eight) hours as needed for nausea or vomiting. 09/01/22  Yes White, Elita Boone, NP  phenazopyridine (PYRIDIUM) 200 MG tablet Take 1 tablet (200 mg total) by mouth 3 (three) times daily. 12/24/22  Yes Bailey Mech, NP  predniSONE (DELTASONE) 5  MG tablet Take by mouth. 07/17/22  Yes [provider]  RABEprazole (ACIPHEX) 20 MG tablet Take 1 tablet by mouth 2 (two) times daily. 05/15/23  Yes [provider]  rosuvastatin (CRESTOR) 5 MG tablet Take 5 mg by mouth daily.   Yes [provider]  rosuvastatin (CRESTOR) 5 MG tablet Take 1 tablet by mouth daily. 11/20/22  Yes [provider]  solifenacin (VESICARE) 10 MG tablet Take 10 mg by mouth daily. 11/09/22  Yes [provider]  solifenacin (VESICARE) 5 MG tablet Take 1 tablet by mouth daily. 04/25/22  Yes [provider]  tamsulosin (FLOMAX) 0.4 MG CAPS capsule Take 1 capsule (0.4 mg total) by mouth daily. 09/01/22  Yes White, Adrienne R, NP  XIIDRA 5 % SOLN Apply 1 drop to eye 2 (two) times daily. 05/10/23  Yes [provider]  azelastine (ASTELIN) 0.1 % nasal spray Place into the nose. 11/08/20 11/08/21  [provider]  pantoprazole (PROTONIX) 20 MG tablet Take by mouth. 12/13/22 03/13/23  [provider]  Esomeprazole Magnesium (NEXIUM PO) Take 22.3 mg by mouth.  09/06/19  [provider]  sodium chloride (OCEAN) 0.65 % SOLN nasal spray Place 2 sprays into both nostrils every 2 (two) hours while awake. 09/12/15 09/06/19  Betancourt, Jarold Song, NP    Family History Family History  Problem Relation Age of Onset   Breast cancer Sister 62    Social History Social History   Tobacco Use   Smoking status: Never   Smokeless tobacco: Never  Vaping Use   Vaping status: Never Used  Substance Use Topics   Alcohol use: No    Alcohol/week: 1.0 standard drink of alcohol    Types: 1 Cans of beer per week    Comment: occassion   Drug use: No     Allergies   Bupropion, Citalopram, Simvastatin, Esomeprazole magnesium, Metformin and related, Pantoprazole, and Prozac [fluoxetine]   Review of Systems Review of Systems  Constitutional:  Negative for chills, fatigue and fever.  Gastrointestinal:  Negative for abdominal pain, diarrhea, nausea and vomiting.  Genitourinary:  Positive for dysuria, flank pain, frequency and urgency. Negative for decreased urine volume, hematuria, pelvic pain, vaginal bleeding, vaginal discharge and vaginal pain.  Musculoskeletal:  Negative for back pain.  Skin:  Negative for rash.     Physical Exam Triage Vital Signs ED Triage Vitals [05/16/23 1134]  Encounter Vitals Group     BP      Systolic BP Percentile      Diastolic BP Percentile      Pulse      Resp 16     Temp      Temp Source Oral     SpO2      Weight      Height      Head Circumference      Peak Flow      Pain Score      Pain Loc      Pain Education      Exclude from Growth Chart    No data found.  Updated Vital Signs BP 127/67 (BP Location: Left Arm)   Pulse 75   Temp 98.6 F (37  C) (Oral)   Resp 16   Ht 5\' 9"  (1.753 m)   Wt 186 lb (84.4 kg)   SpO2 96%   BMI 27.47 kg/m   Physical Exam Vitals and nursing note reviewed.  Constitutional:      General: She is not in  acute distress.    Appearance: Normal appearance. She is not ill-appearing or toxic-appearing.  HENT:     Head: Normocephalic and atraumatic.  Eyes:     General: No scleral icterus.       Right eye: No discharge.        Left eye: No discharge.     Conjunctiva/sclera: Conjunctivae normal.  Cardiovascular:     Rate and Rhythm: Normal rate and regular rhythm.     Heart sounds: Normal heart sounds.  Pulmonary:     Effort: Pulmonary effort is normal. No respiratory distress.     Breath sounds: Normal breath sounds.  Abdominal:     Palpations: Abdomen is soft.     Tenderness: There is no abdominal tenderness. There is left CVA tenderness. There is no right CVA tenderness.  Musculoskeletal:     Cervical back: Neck supple.  Skin:    General: Skin is dry.  Neurological:     General: No focal deficit present.     Mental Status: She is alert. Mental status is at baseline.     Motor: No weakness.     Gait: Gait normal.  Psychiatric:        Mood and Affect: Mood normal.        Behavior: Behavior normal.        Thought Content: Thought content normal.      UC Treatments / Results  Labs (all labs ordered are listed, but only abnormal results are displayed) Labs Reviewed  URINALYSIS, W/ REFLEX TO CULTURE (INFECTION SUSPECTED) - Abnormal; Notable for the following components:      Result Value   Hgb urine dipstick MODERATE (*)    Leukocytes,Ua SMALL (*)    Bacteria, UA FEW (*)    All other components within normal limits  URINE CULTURE    EKG   Radiology No results found.  Procedures Procedures (including critical care time)  Medications Ordered in UC Medications - No data to display  Initial Impression / Assessment and Plan / UC Course  I have reviewed the triage vital signs  and the nursing notes.  Pertinent labs & imaging results that were available during my care of the patient were reviewed by me and considered in my medical decision making (see chart for details).   82 year old female presents for dysuria, frequency and urgency x 1 week.  No fever, hematuria, abdominal pain.  Recently on Augmentin but stopped taking it 2 days ago.  Patient reports that she needs Cipro for her UTI.  She believes it is actually a kidney infection.  History of recurrent UTIs.  Has an appointment with urology later this month.  Vitals are normal and stable and she is overall well-appearing.  Abdomen soft and nontender.  No CVA tenderness.  Chest clear.  Heart regular rate and rhythm.  Urinalysis obtained.  UA shows moderate hemoglobin, small leukocytes, bacteria.  Will send urine for culture.  Reviewed results of UA with patient.  Encouraged increasing rest and fluids.  Sent Cipro.  Encouraged her to monitor her blood sugars.  Advised Cipro can interact with sulfa anuria and lead to lower blood sugars.  If this occurs she should discontinue the medicine and inform her PCP.  She has taken Cipro multiple times before and done well with it.  Will amend antibiotics based on culture if needed.  Reviewed return to ER precautions.   Final Clinical Impressions(s) / UC Diagnoses   Final diagnoses:  Urinary tract infection without hematuria, site unspecified  Left flank pain  Dysuria     Discharge Instructions      UTI: Based on either symptoms or urinalysis, you may have a urinary tract infection. We will send the urine for culture and call with results in a few days. Begin antibiotics at this time. Your symptoms should be much improved over the next 2-3 days. Increase rest and fluid intake. If for some reason symptoms are worsening or not improving after a couple of days or the urine culture determines the antibiotics you are taking will not treat the infection, the antibiotics may be  changed. Return or go to ER for fever, back pain, worsening urinary pain, discharge, increased blood in urine. May take Tylenol or Motrin OTC for pain relief or consider AZO if no contraindications   Keep a close eye on your blood sugar. Cipro +diabetes medicine can interact and lead to low blood sugar. If this happens, please stop the medicine and follow up.       ED Prescriptions     Medication Sig Dispense Auth. Provider   ciprofloxacin (CIPRO) 500 MG tablet Take 1 tablet (500 mg total) by mouth every 12 (twelve) hours for 7 days. 14 tablet Gareth Morgan      PDMP not reviewed this encounter.   Shirlee Latch, PA-C 05/16/23 1227

## 2023-05-16 NOTE — Discharge Instructions (Addendum)
UTI: Based on either symptoms or urinalysis, you may have a urinary tract infection. We will send the urine for culture and call with results in a few days. Begin antibiotics at this time. Your symptoms should be much improved over the next 2-3 days. Increase rest and fluid intake. If for some reason symptoms are worsening or not improving after a couple of days or the urine culture determines the antibiotics you are taking will not treat the infection, the antibiotics may be changed. Return or go to ER for fever, back pain, worsening urinary pain, discharge, increased blood in urine. May take Tylenol or Motrin OTC for pain relief or consider AZO if no contraindications   Keep a close eye on your blood sugar. Cipro +diabetes medicine can interact and lead to low blood sugar. If this happens, please stop the medicine and follow up.

## 2023-05-18 LAB — URINE CULTURE: Culture: <10000 — AB

## 2023-06-21 ENCOUNTER — Ambulatory Visit
Admission: EM | Admit: 2023-06-21 | Discharge: 2023-06-21 | Disposition: A | Discharge status: Home / Self Care | Payer: Medicare Other | Attending: Family Medicine | Admitting: Family Medicine

## 2023-06-21 ENCOUNTER — Encounter: Payer: Self-pay | Admitting: Emergency Medicine

## 2023-06-21 DIAGNOSIS — L03032 Cellulitis of left toe: Secondary | ICD-10-CM | POA: Diagnosis not present

## 2023-06-21 NOTE — ED Triage Notes (Signed)
Pt has been seen by her podiatrist for a ingrown toenail on her left big toe. She doesn't think the toe is getting any better. She was prescribed cefdinir on 11/1 and doxycycline and cipro yesterday.

## 2023-06-21 NOTE — Discharge Instructions (Addendum)
Take your antibiotics as previously prescribed.  Follow up with Dr Ether Griffins as scheduled.  Keep your blood sugars controlled to promote healing.

## 2023-06-21 NOTE — ED Provider Notes (Signed)
[facility]    CSN: [id number] Arrival date & time: 06/21/23  1042      History   Chief Complaint Chief Complaint  Patient presents with   Nail Problem    HPI Avva Winns is a 82 y.o. female.   HPI  Jalessa presents for nail problem for the past 2 weeks. She had ingorwn toenails out.   He has been seeing Dr Ether Griffins, her podiatrist.  She was told to follow up next week.   Has new redness underneath the toenail and was given antibiotics.  She doesn't want him to take the toenail off.  Request topical antibiotic as Neosporin is not helping.    Past Medical History:  Diagnosis Date   Anemia    COVID-19 04/2021   Depression    Diabetes mellitus without complication (HCC)    GERD (gastroesophageal reflux disease)    Head ache    Hypertension    Osteoarthritis    Stroke [facility])    TIA   TIA (transient ischemic attack)     Patient Active Problem List   Diagnosis Date Noted   Nonspecific syndrome suggestive of viral illness 05/01/2023   Suspected exposure to mold 05/01/2023   COVID-19 04/2021   Acute pain of right knee 01/22/2018   Osteopenia 09/11/2017   Presbyesophagus 09/11/2017   Primary osteoarthritis of right knee 10/26/2016   Other constipation 06/19/2016   Hypercholesterolemia 06/08/2016   New onset type 2 diabetes mellitus (HCC) 06/08/2016   Vitamin B 12 deficiency 06/08/2016   Recurrent major depressive disorder (HCC) 04/14/2016   Headache 09/20/2015   Depression 01/07/2014   GERD (gastroesophageal reflux disease) 01/07/2014    Past Surgical History:  Procedure Laterality Date   ABDOMINAL HYSTERECTOMY     CATARACT EXTRACTION Bilateral    COLONOSCOPY     COLONOSCOPY WITH PROPOFOL N/A 11/01/2017   Procedure: COLONOSCOPY WITH PROPOFOL;  Surgeon: Christena Deem, MD;  Location: [facility] ENDOSCOPY;  Service: Endoscopy;  Laterality: N/A;   COLONOSCOPY WITH PROPOFOL N/A 01/03/2021   Procedure: COLONOSCOPY WITH PROPOFOL;  Surgeon: Regis Bill, MD;  Location: [facility] ENDOSCOPY;  Service: Endoscopy;  Laterality: N/A;   ESOPHAGOGASTRODUODENOSCOPY     ESOPHAGOGASTRODUODENOSCOPY (EGD) WITH PROPOFOL N/A 06/29/2016   Procedure: ESOPHAGOGASTRODUODENOSCOPY (EGD) WITH PROPOFOL;  Surgeon: Christena Deem, MD;  Location: [facility] ENDOSCOPY;  Service: Endoscopy;  Laterality: N/A;   ESOPHAGOGASTRODUODENOSCOPY (EGD) WITH PROPOFOL N/A 11/01/2017   Procedure: ESOPHAGOGASTRODUODENOSCOPY (EGD) WITH PROPOFOL;  Surgeon: Christena Deem, MD;  Location: [facility] ENDOSCOPY;  Service: Endoscopy;  Laterality: N/A;   ESOPHAGOGASTRODUODENOSCOPY (EGD) WITH PROPOFOL N/A 01/03/2021   Procedure: ESOPHAGOGASTRODUODENOSCOPY (EGD) WITH PROPOFOL;  Surgeon: Regis Bill, MD;  Location: [facility] ENDOSCOPY;  Service: Endoscopy;  Laterality: N/A;   ROTATOR CUFF REPAIR     TUBAL LIGATION      OB History   No obstetric history on file.      Home Medications    Prior to Admission medications   Medication Sig Start Date End Date Taking? Authorizing Provider  ACCU-CHEK AVIVA PLUS test strip 1 each 3 (three) times daily. Last check, 12-23-2022, HS-Result 202 10/24/20   [provider]  Alcohol Swabs (ALCOHOL PREP) 70 % PADS Use to check blood sugar twice daily. 12/14/21   [provider]  aspirin 81 MG chewable tablet Chew by mouth.    [provider]  aspirin 81 MG tablet Take 81 mg by mouth daily.    [provider]  azelastine (ASTELIN) 0.1 %  nasal spray Place into the nose. 11/08/20 11/08/21  [provider]  azelastine (ASTELIN) 0.1 % nasal spray 1 spray into each nostril two (2) times a day. Use in each nostril as directed 08/19/22 08/19/23  [provider]  cetirizine-pseudoephedrine (ZYRTEC-D) 5-120 MG tablet Take 1 tablet by mouth daily. 09/06/19   Verlee Monte, NP  chlorpheniramine-HYDROcodone Stevphen Meuse) 10-8 MG/5ML Take by mouth. 07/26/22   [provider]  cholecalciferol (VITAMIN D) 1000 units  tablet Take 1,000 Units by mouth 2 (two) times daily.     [provider]  Cholecalciferol (VITAMIN D3) 25 MCG (1000 UT) CAPS Take by mouth.    [provider]  cyanocobalamin (VITAMIN B12) 1000 MCG tablet Take 1 tablet by mouth daily.    [provider]  cyanocobalamin 1000 MCG tablet Take 1,000 mcg by mouth daily.    [provider]  famotidine (PEPCID) 40 MG tablet TAKE 1 TABLET BY MOUTH EVERYDAY AT BEDTIME 04/12/22   [provider]  fluconazole (DIFLUCAN) 150 MG tablet Take 1 tablet (150 mg total) by mouth every 3 (three) days as needed. 12/24/22   Bailey Mech, NP  fluticasone (FLONASE) 50 MCG/ACT nasal spray Place 1 spray into both nostrils daily. 07/13/22   Candis Schatz, PA-C  glipiZIDE (GLUCOTROL XL) 5 MG 24 hr tablet Take 5 mg by mouth daily. 11/19/20   [provider]  glipiZIDE (GLUCOTROL) 10 MG tablet Take by mouth.    [provider]  glipiZIDE (GLUCOTROL) 5 MG tablet 2 tabs po qam and 1 tab po qpm 12/05/22   [provider]  guaiFENesin-dextromethorphan (ROBITUSSIN DM) 100-10 MG/5ML syrup Take 5 mLs by mouth every 4 (four) hours as needed for cough. 07/13/22   Candis Schatz, PA-C  ipratropium (ATROVENT) 0.03 % nasal spray Place 2 sprays into both nostrils every 12 (twelve) hours. 09/01/22   Valinda Hoar, NP  lactulose (CHRONULAC) 10 GM/15ML solution SMARTSIG:30 Milliliter(s) By Mouth Daily PRN 10/23/22   [provider]  Lancets [facility] PLUS [place]) MISC  10/26/22   [provider]  Lancets MISC 2 (two) times daily. 12/14/21   [provider]  lansoprazole (PREVACID) 30 MG capsule Take 30 mg by mouth daily. 05/20/20   [provider]  meloxicam (MOBIC) 7.5 MG tablet Take 1 tablet (7.5 mg total) by mouth daily. 09/01/22   White, Elita Boone, NP  methylPREDNISolone (MEDROL DOSEPAK) 4 MG TBPK tablet See admin instructions. 08/19/22   [provider]  Milk  Thistle Extract 175 MG TABS Take by mouth.    [provider]  mometasone (NASONEX) 50 MCG/ACT nasal spray 2 sprays 2 (two) times daily. 09/18/22   [provider]  Multiple Vitamins-Minerals (MULTIVITAMIN WITH MINERALS) tablet Take 1 tablet by mouth daily.    [provider]  nitrofurantoin, macrocrystal-monohydrate, (MACROBID) 100 MG capsule Take 1 capsule (100 mg total) by mouth 2 (two) times daily. 12/24/22   Bailey Mech, NP  ondansetron (ZOFRAN) 4 MG tablet Take 4 mg by mouth every 8 (eight) hours as needed. 11/16/20   [provider]  ondansetron (ZOFRAN-ODT) 4 MG disintegrating tablet Take 1 tablet (4 mg total) by mouth every 8 (eight) hours as needed for nausea or vomiting. 09/01/22   White, Elita Boone, NP  pantoprazole (PROTONIX) 20 MG tablet Take by mouth. 12/13/22 03/13/23  [provider]  phenazopyridine (PYRIDIUM) 200 MG tablet Take 1 tablet (200 mg total) by mouth 3 (three) times daily. 12/24/22  Bailey Mech, NP  predniSONE (DELTASONE) 5 MG tablet Take by mouth. 07/17/22   [provider]  RABEprazole (ACIPHEX) 20 MG tablet Take 1 tablet by mouth 2 (two) times daily. 05/15/23   [provider]  rosuvastatin (CRESTOR) 5 MG tablet Take 5 mg by mouth daily.    [provider]  rosuvastatin (CRESTOR) 5 MG tablet Take 1 tablet by mouth daily. 11/20/22   [provider]  solifenacin (VESICARE) 10 MG tablet Take 10 mg by mouth daily. 11/09/22   [provider]  solifenacin (VESICARE) 5 MG tablet Take 1 tablet by mouth daily. 04/25/22   [provider]  tamsulosin (FLOMAX) 0.4 MG CAPS capsule Take 1 capsule (0.4 mg total) by mouth daily. 09/01/22   White, Elita Boone, NP  XIIDRA 5 % SOLN Apply 1 drop to eye 2 (two) times daily. 05/10/23   [provider]  Esomeprazole Magnesium (NEXIUM PO) Take 22.3 mg by mouth.  09/06/19  [provider]  sodium chloride (OCEAN) 0.65 % SOLN nasal spray  Place 2 sprays into both nostrils every 2 (two) hours while awake. 09/12/15 09/06/19  Betancourt, Jarold Song, NP    Family History Family History  Problem Relation Age of Onset   Breast cancer Sister 52    Social History Social History   Tobacco Use   Smoking status: Never   Smokeless tobacco: Never  Vaping Use   Vaping status: Never Used  Substance Use Topics   Alcohol use: No    Alcohol/week: 1.0 standard drink of alcohol    Types: 1 Cans of beer per week    Comment: occassion   Drug use: No     Allergies   Bupropion, Citalopram, Simvastatin, Esomeprazole magnesium, Metformin and related, Pantoprazole, and Prozac [fluoxetine]   Review of Systems Review of Systems :negative unless otherwise stated in HPI.      Physical Exam Triage Vital Signs ED Triage Vitals  Encounter Vitals Group     BP 06/21/23 1218 (!) 152/80     Systolic BP Percentile --      Diastolic BP Percentile --      Pulse Rate 06/21/23 1218 94     Resp 06/21/23 1218 16     Temp 06/21/23 1218 98.3 F (36.8 C)     Temp Source 06/21/23 1218 Oral     SpO2 06/21/23 1218 97 %     Weight --      Height --      Head Circumference --      Peak Flow --      Pain Score 06/21/23 1215 7     Pain Loc --      Pain Education --      Exclude from Growth Chart --    No data found.  Updated Vital Signs BP (!) 152/80 (BP Location: Left Arm)   Pulse 94   Temp 98.3 F (36.8 C) (Oral)   Resp 16   SpO2 97%   Visual Acuity Right Eye Distance:   Left Eye Distance:   Bilateral Distance:    Right Eye Near:   Left Eye Near:    Bilateral Near:     Physical Exam  GEN: alert, well appearing elderly female, in no acute distress  CV: regular rate, strong DP pulse RESP: no increased work of breathing,  MSK: no normal range of motion of left great toe and ankle NEURO: alert, moves all extremities appropriately,  SKIN: warm and dry; medial TTP of left  great toe without active drainage, prior wedge incision  visible on nail, periungal eyrthema      UC Treatments / Results  Labs (all labs ordered are listed, but only abnormal results are displayed) Labs Reviewed - No data to display  EKG   Radiology No results found.  Procedures Procedures (including critical care time)  Medications Ordered in UC Medications - No data to display  Initial Impression / Assessment and Plan / UC Course  I have reviewed the triage vital signs and the nursing notes.  Pertinent labs & imaging results that were available during my care of the patient were reviewed by me and considered in my medical decision making (see chart for details).     Patient is a 82 y.o. femalewho presents for concern for ingrown toenail of her left great toe.  Overall, patient is well-appearing and well-hydrated.  Vital signs stable.  Meira is afebrile.  Exam concerning for paronychia.    Reviewed office notes from Dr Ether Griffins, podiatrist office.  Pt diagnosed with paronychia. She is prescribed Septra but was switched to Cipro and doxycyline yesterday at Dr Earney Mallet office.  Advised patient that she does not need a topical antibiotic as she is on 2 antibiotics to help cover for this.  It does not appear that he is planning to cut off her toe either.  Reassurance provided.  Patient will follow-up as scheduled with her podiatrist Dr. Ether Griffins.   Reviewed expectations regarding course of current medical issues.  All questions asked were answered.  Outlined signs and symptoms indicating need for more acute intervention. Patient verbalized understanding. After Visit Summary given.   Final Clinical Impressions(s) / UC Diagnoses   Final diagnoses:  Paronychia of great toe, left     Discharge Instructions      Take your antibiotics as previously prescribed.  Follow up with Dr Ether Griffins as scheduled.  Keep your blood sugars controlled to promote healing.       ED Prescriptions   None    PDMP not reviewed this  encounter.              Katha Cabal, DO 06/29/23 2335

## 2023-09-04 ENCOUNTER — Encounter: Payer: Self-pay | Admitting: Emergency Medicine

## 2023-09-04 ENCOUNTER — Ambulatory Visit
Admission: EM | Admit: 2023-09-04 | Discharge: 2023-09-04 | Disposition: A | Discharge status: Home / Self Care | Payer: Medicare Other

## 2023-09-04 DIAGNOSIS — J069 Acute upper respiratory infection, unspecified: Secondary | ICD-10-CM | POA: Insufficient documentation

## 2023-09-04 DIAGNOSIS — B9789 Other viral agents as the cause of diseases classified elsewhere: Secondary | ICD-10-CM | POA: Diagnosis not present

## 2023-09-04 LAB — RESP PANEL BY RT-PCR (FLU A&B, COVID) ARPGX2
Influenza A by PCR: NEGATIVE
Influenza B by PCR: NEGATIVE
SARS Coronavirus 2 by RT PCR: NEGATIVE

## 2023-09-04 LAB — GROUP A STREP BY PCR: Group A Strep by PCR: NOT DETECTED

## 2023-09-04 MED ORDER — IPRATROPIUM BROMIDE 0.06 % NA SOLN: 2.0000 | Freq: Four times a day (QID) | NASAL | 12 refills | Status: DC

## 2023-09-04 MED ORDER — PROMETHAZINE-DM 6.25-15 MG/5ML PO SYRP: 5.0000 mL | ORAL_SOLUTION | Freq: Four times a day (QID) | ORAL | 0 refills | Status: DC | PRN

## 2023-09-04 MED ORDER — BENZONATATE 100 MG PO CAPS: 200.0000 mg | ORAL_CAPSULE | Freq: Three times a day (TID) | ORAL | 0 refills | Status: DC

## 2023-09-04 NOTE — Discharge Instructions (Addendum)
Your testing today for COVID, flu, and strep were all negative.  I do believe you have a viral respiratory infection causing your symptoms.  Use over-the-counter Tylenol according the package instructions as needed for any fever or pain.  Use the Atrovent nasal spray, 2 squirts in each nostril every 6 hours, as needed for runny nose and postnasal drip.  Hold your azelastine nasal spray while you are taking the Atrovent nasal spray.  You may resume the azelastine once your cold symptoms have resolved.  Use the Tessalon Perles every 8 hours during the day.  Take them with a small sip of water.  They may give you some numbness to the base of your tongue or a metallic taste in your mouth, this is normal.  Use the Promethazine DM cough syrup at bedtime for cough and congestion.  It will make you drowsy so do not take it during the day.  Return for reevaluation or see your primary care provider for any new or worsening symptoms.

## 2023-09-04 NOTE — ED Provider Notes (Signed)
[facility]    CSN: [id number] Arrival date & time: 09/04/23  6578      History   Chief Complaint Chief Complaint  Patient presents with   Headache   Otalgia   Cough   Sore Throat    HPI Sruti Hatice Daloia is a 83 y.o. female.   HPI  83 year old female with a past medical history significant for hypertension, osteoarthritis, CVA, TIA, GERD, diabetes, depression, and anemia presents for evaluation of 3 days with the respiratory symptoms that worsened abruptly this morning.  Her most significant complaint is a headache and bodyaches.  She also endorses nasal congestion but denies any nasal discharge, pain in her left ear, sore throat, and a nonproductive cough.  No associated shortness of breath or wheezing.  Past Medical History:  Diagnosis Date   Anemia    COVID-19 04/2021   Depression    Diabetes mellitus without complication (HCC)    GERD (gastroesophageal reflux disease)    Head ache    Hypertension    Osteoarthritis    Stroke [facility])    TIA   TIA (transient ischemic attack)     Patient Active Problem List   Diagnosis Date Noted   Nonspecific syndrome suggestive of viral illness 05/01/2023   Suspected exposure to mold 05/01/2023   COVID-19 04/2021   Acute pain of right knee 01/22/2018   Osteopenia 09/11/2017   Presbyesophagus 09/11/2017   Primary osteoarthritis of right knee 10/26/2016   Other constipation 06/19/2016   Hypercholesterolemia 06/08/2016   New onset type 2 diabetes mellitus (HCC) 06/08/2016   Vitamin B 12 deficiency 06/08/2016   Recurrent major depressive disorder (HCC) 04/14/2016   Headache 09/20/2015   Depression 01/07/2014   GERD (gastroesophageal reflux disease) 01/07/2014    Past Surgical History:  Procedure Laterality Date   ABDOMINAL HYSTERECTOMY     CATARACT EXTRACTION Bilateral    COLONOSCOPY     COLONOSCOPY WITH PROPOFOL N/A 11/01/2017   Procedure: COLONOSCOPY WITH PROPOFOL;  Surgeon: Christena Deem, MD;   Location: [facility] ENDOSCOPY;  Service: Endoscopy;  Laterality: N/A;   COLONOSCOPY WITH PROPOFOL N/A 01/03/2021   Procedure: COLONOSCOPY WITH PROPOFOL;  Surgeon: Regis Bill, MD;  Location: [facility] ENDOSCOPY;  Service: Endoscopy;  Laterality: N/A;   ESOPHAGOGASTRODUODENOSCOPY     ESOPHAGOGASTRODUODENOSCOPY (EGD) WITH PROPOFOL N/A 06/29/2016   Procedure: ESOPHAGOGASTRODUODENOSCOPY (EGD) WITH PROPOFOL;  Surgeon: Christena Deem, MD;  Location: [facility] ENDOSCOPY;  Service: Endoscopy;  Laterality: N/A;   ESOPHAGOGASTRODUODENOSCOPY (EGD) WITH PROPOFOL N/A 11/01/2017   Procedure: ESOPHAGOGASTRODUODENOSCOPY (EGD) WITH PROPOFOL;  Surgeon: Christena Deem, MD;  Location: [facility] ENDOSCOPY;  Service: Endoscopy;  Laterality: N/A;   ESOPHAGOGASTRODUODENOSCOPY (EGD) WITH PROPOFOL N/A 01/03/2021   Procedure: ESOPHAGOGASTRODUODENOSCOPY (EGD) WITH PROPOFOL;  Surgeon: Regis Bill, MD;  Location: [facility] ENDOSCOPY;  Service: Endoscopy;  Laterality: N/A;   ROTATOR CUFF REPAIR     TUBAL LIGATION      OB History   No obstetric history on file.      Home Medications    Prior to Admission medications   Medication Sig Start Date End Date Taking? Authorizing Provider  benzonatate (TESSALON) 100 MG capsule Take 2 capsules (200 mg total) by mouth every 8 (eight) hours. 09/04/23  Yes Becky Augusta, NP  estradiol (ESTRACE) 0.1 MG/GM vaginal cream Place vaginally. 04/09/23 06/25/24 Yes [provider]  ipratropium (ATROVENT) 0.06 % nasal spray Place 2 sprays into both nostrils 4 (four) times daily. 09/04/23  Yes Becky Augusta, NP  promethazine-dextromethorphan (PROMETHAZINE-DM) 6.25-15  MG/5ML syrup Take 5 mLs by mouth 4 (four) times daily as needed. 09/04/23  Yes Becky Augusta, NP  ACCU-CHEK AVIVA PLUS test strip 1 each 3 (three) times daily. Last check, 12-23-2022, HS-Result 202 10/24/20   [provider]  Alcohol Swabs (ALCOHOL PREP) 70 % PADS Use to check blood sugar twice daily. 12/14/21   [provider]  aspirin 81 MG chewable tablet Chew by mouth.    [provider]  aspirin 81 MG tablet Take 81 mg by mouth daily.    [provider]  azelastine (ASTELIN) 0.1 % nasal spray Place into the nose. 11/08/20 11/08/21  [provider]  azelastine (ASTELIN) 0.1 % nasal spray 1 spray into each nostril two (2) times a day. Use in each nostril as directed 08/19/22 08/19/23  [provider]  cetirizine-pseudoephedrine (ZYRTEC-D) 5-120 MG tablet Take 1 tablet by mouth daily. 09/06/19   Verlee Monte, NP  cholecalciferol (VITAMIN D) 1000 units tablet Take 1,000 Units by mouth 2 (two) times daily.     [provider]  Cholecalciferol (VITAMIN D3) 25 MCG (1000 UT) CAPS Take by mouth.    [provider]  cyanocobalamin (VITAMIN B12) 1000 MCG tablet Take 1 tablet by mouth daily.    [provider]  cyanocobalamin 1000 MCG tablet Take 1,000 mcg by mouth daily.    [provider]  famotidine (PEPCID) 40 MG tablet TAKE 1 TABLET BY MOUTH EVERYDAY AT BEDTIME 04/12/22   [provider]  fluconazole (DIFLUCAN) 150 MG tablet Take 1 tablet (150 mg total) by mouth every 3 (three) days as needed. 12/24/22   Bailey Mech, NP  fluticasone (FLONASE) 50 MCG/ACT nasal spray Place 1 spray into both nostrils daily. 07/13/22   Candis Schatz, PA-C  glipiZIDE (GLUCOTROL XL) 5 MG 24 hr tablet Take 5 mg by mouth daily. 11/19/20   [provider]  glipiZIDE (GLUCOTROL) 10 MG tablet Take by mouth.    [provider]  glipiZIDE (GLUCOTROL) 5 MG tablet 2 tabs po qam and 1 tab po qpm 12/05/22   [provider]  lactulose (CHRONULAC) 10 GM/15ML solution SMARTSIG:30 Milliliter(s) By Mouth Daily PRN 10/23/22   [provider]  Lancets Letta Pate DELICA PLUS [place]) MISC  10/26/22   [provider]  Lancets MISC 2 (two) times daily. 12/14/21   [provider]  lansoprazole (PREVACID) 30 MG capsule Take  30 mg by mouth daily. 05/20/20   [provider]  meloxicam (MOBIC) 7.5 MG tablet Take 1 tablet (7.5 mg total) by mouth daily. 09/01/22   White, Elita Boone, NP  methylPREDNISolone (MEDROL DOSEPAK) 4 MG TBPK tablet See admin instructions. 08/19/22   [provider]  Milk Thistle Extract 175 MG TABS Take by mouth.    [provider]  mometasone (NASONEX) 50 MCG/ACT nasal spray 2 sprays 2 (two) times daily. 09/18/22   [provider]  Multiple Vitamins-Minerals (MULTIVITAMIN WITH MINERALS) tablet Take 1 tablet by mouth daily.    [provider]  nitrofurantoin, macrocrystal-monohydrate, (MACROBID) 100 MG capsule Take 1 capsule (100 mg total) by mouth 2 (two) times daily. 12/24/22   Bailey Mech, NP  ondansetron (ZOFRAN) 4 MG tablet Take 4 mg by mouth every 8 (eight) hours as needed. 11/16/20   [provider]  ondansetron (ZOFRAN-ODT) 4 MG disintegrating tablet Take 1 tablet (4 mg total) by mouth every 8 (eight) hours as needed for nausea or vomiting. 09/01/22   Valinda Hoar, NP  pantoprazole (PROTONIX) 20 MG tablet Take by mouth. 12/13/22 03/13/23  [provider]  phenazopyridine (PYRIDIUM) 200 MG tablet Take 1 tablet (200 mg total) by mouth 3 (three) times daily. 12/24/22   Bailey Mech, NP  predniSONE (DELTASONE) 5 MG tablet Take by mouth. 07/17/22   [provider]  RABEprazole (ACIPHEX) 20 MG tablet Take 1 tablet by mouth 2 (two) times daily. 05/15/23   [provider]  rosuvastatin (CRESTOR) 5 MG tablet Take 5 mg by mouth daily.    [provider]  rosuvastatin (CRESTOR) 5 MG tablet Take 1 tablet by mouth daily. 11/20/22   [provider]  solifenacin (VESICARE) 10 MG tablet Take 10 mg by mouth daily. 11/09/22   [provider]  solifenacin (VESICARE) 5 MG tablet Take 1 tablet by mouth daily. 04/25/22   [provider]  tamsulosin (FLOMAX) 0.4 MG CAPS capsule Take 1 capsule (0.4 mg  total) by mouth daily. 09/01/22   White, Elita Boone, NP  XIIDRA 5 % SOLN Apply 1 drop to eye 2 (two) times daily. 05/10/23   [provider]  Esomeprazole Magnesium (NEXIUM PO) Take 22.3 mg by mouth.  09/06/19  [provider]  sodium chloride (OCEAN) 0.65 % SOLN nasal spray Place 2 sprays into both nostrils every 2 (two) hours while awake. 09/12/15 09/06/19  Betancourt, Jarold Song, NP    Family History Family History  Problem Relation Age of Onset   Breast cancer Sister 24    Social History Social History   Tobacco Use   Smoking status: Never   Smokeless tobacco: Never  Vaping Use   Vaping status: Never Used  Substance Use Topics   Alcohol use: No    Alcohol/week: 1.0 standard drink of alcohol    Types: 1 Cans of beer per week   Drug use: No     Allergies   Bupropion, Citalopram, Simvastatin, Esomeprazole magnesium, Metformin and related, Pantoprazole, and Prozac [fluoxetine]   Review of Systems Review of Systems  Constitutional:  Negative for fever.  HENT:  Positive for congestion, ear pain and sore throat. Negative for rhinorrhea.   Respiratory:  Positive for cough. Negative for shortness of breath and wheezing.   Musculoskeletal:  Positive for arthralgias and myalgias.  Neurological:  Positive for headaches.     Physical Exam Triage Vital Signs ED Triage Vitals  Encounter Vitals Group     BP      Systolic BP Percentile      Diastolic BP Percentile      Pulse      Resp      Temp      Temp src      SpO2      Weight      Height      Head Circumference      Peak Flow      Pain Score      Pain Loc      Pain Education      Exclude from Growth Chart    No data found.  Updated Vital Signs BP 126/77 (BP Location: Left Arm)   Pulse 100   Temp 98.4 F (36.9 C) (Oral)   Resp 18   SpO2 95%   Visual Acuity Right Eye Distance:   Left Eye Distance:   Bilateral Distance:    Right Eye Near:   Left Eye Near:    Bilateral Near:     Physical  Exam Vitals and nursing note reviewed.  Constitutional:  Appearance: She is ill-appearing.  HENT:     Head: Normocephalic and atraumatic.     Right Ear: Tympanic membrane, ear canal and external ear normal. There is no impacted cerumen.     Left Ear: Tympanic membrane, ear canal and external ear normal. There is no impacted cerumen.     Nose: Congestion and rhinorrhea present.     Comments: Nasal mucosa is edematous erythematous with scant clear discharge in both nares.    Mouth/Throat:     Mouth: Mucous membranes are moist.     Pharynx: Oropharynx is clear. Posterior oropharyngeal erythema present. No oropharyngeal exudate.     Comments: Tonsillar pillars are erythematous and 1+ edematous without exudate.  Posterior oropharynx also demonstrates erythema with clear postnasal drip. Neck:     Comments: Anterior and posterior cervical region are tender to palpation but no appreciable lymphadenopathy. Cardiovascular:     Rate and Rhythm: Normal rate and regular rhythm.     Pulses: Normal pulses.     Heart sounds: Normal heart sounds. No murmur heard.    No friction rub. No gallop.  Pulmonary:     Effort: Pulmonary effort is normal.     Breath sounds: Normal breath sounds. No wheezing, rhonchi or rales.  Musculoskeletal:     Cervical back: Normal range of motion and neck supple. Tenderness present.  Lymphadenopathy:     Cervical: No cervical adenopathy.  Skin:    General: Skin is warm and dry.     Capillary Refill: Capillary refill takes less than 2 seconds.     Findings: No rash.  Neurological:     General: No focal deficit present.     Mental Status: She is alert and oriented to person, place, and time.      UC Treatments / Results  Labs (all labs ordered are listed, but only abnormal results are displayed) Labs Reviewed  GROUP A STREP BY PCR  RESP PANEL BY RT-PCR (FLU A&B, COVID) [id number]    EKG   Radiology No results found.  Procedures Procedures (including  critical care time)  Medications Ordered in UC Medications - No data to display  Initial Impression / Assessment and Plan / UC Course  I have reviewed the triage vital signs and the nursing notes.  Pertinent labs & imaging results that were available during my care of the patient were reviewed by me and considered in my medical decision making (see chart for details).   Patient is a pleasant, though ill-appearing, 83 year old female presenting for evaluation of 3 days worth of COVID/flulike symptoms as outlined HPI above.  Her most significant symptoms are headache and bodyaches.  She does have inflammation of her upper respiratory tract as well as erythema and edema to her oropharynx.  Strep PCR was collected at triage.  I will also order a COVID and influenza PCR.  Even though she has had symptoms for 3 days, due to her age and past medical history should she turn out flu positive I will start her on Tamiflu being that she is at higher risk.  Her cough is nonproductive and her lungs are clear to auscultation in all fields.  I do not feel chest x-ray is warranted at this time.  Strep PCR is negative.  Respiratory panel is negative for COVID and influenza.  I will discharge patient home with a diagnosis of viral URI with a cough with prescription for Atrovent nasal spray to help with nasal congestion and Tessalon Perles and Promethazine DM cough syrup  for cough and congestion.  She may use over-the-counter Tylenol according the package instructions as needed for fever or pain.  Return precautions reviewed.   Final Clinical Impressions(s) / UC Diagnoses   Final diagnoses:  Viral URI with cough     Discharge Instructions      Your testing today for COVID, flu, and strep were all negative.  I do believe you have a viral respiratory infection causing your symptoms.  Use over-the-counter Tylenol according the package instructions as needed for any fever or pain.  Use the Atrovent nasal  spray, 2 squirts in each nostril every 6 hours, as needed for runny nose and postnasal drip.  Hold your azelastine nasal spray while you are taking the Atrovent nasal spray.  You may resume the azelastine once your cold symptoms have resolved.  Use the Tessalon Perles every 8 hours during the day.  Take them with a small sip of water.  They may give you some numbness to the base of your tongue or a metallic taste in your mouth, this is normal.  Use the Promethazine DM cough syrup at bedtime for cough and congestion.  It will make you drowsy so do not take it during the day.  Return for reevaluation or see your primary care provider for any new or worsening symptoms.      ED Prescriptions     Medication Sig Dispense Auth. Provider   benzonatate (TESSALON) 100 MG capsule Take 2 capsules (200 mg total) by mouth every 8 (eight) hours. 21 capsule Becky Augusta, NP   ipratropium (ATROVENT) 0.06 % nasal spray Place 2 sprays into both nostrils 4 (four) times daily. 15 mL Becky Augusta, NP   promethazine-dextromethorphan (PROMETHAZINE-DM) 6.25-15 MG/5ML syrup Take 5 mLs by mouth 4 (four) times daily as needed. 118 mL Becky Augusta, NP      PDMP not reviewed this encounter.   Becky Augusta, NP 09/04/23 1000

## 2023-09-04 NOTE — ED Triage Notes (Signed)
Patient c/o non productive cough, sore throat, headache and left ear pain x 3 days.

## 2023-10-10 ENCOUNTER — Ambulatory Visit
Admission: EM | Admit: 2023-10-10 | Discharge: 2023-10-10 | Disposition: A | Discharge status: Home / Self Care | Payer: Medicare Other | Attending: Family Medicine | Admitting: Family Medicine

## 2023-10-10 DIAGNOSIS — J069 Acute upper respiratory infection, unspecified: Secondary | ICD-10-CM | POA: Insufficient documentation

## 2023-10-10 DIAGNOSIS — R051 Acute cough: Secondary | ICD-10-CM | POA: Diagnosis not present

## 2023-10-10 LAB — RESP PANEL BY RT-PCR (RSV, FLU A&B, COVID)  RVPGX2
Influenza A by PCR: NEGATIVE
Influenza B by PCR: NEGATIVE
Resp Syncytial Virus by PCR: NEGATIVE
SARS Coronavirus 2 by RT PCR: NEGATIVE

## 2023-10-10 LAB — GLUCOSE, CAPILLARY: Glucose-Capillary: 112 mg/dL — ABNORMAL HIGH (ref 70–99)

## 2023-10-10 MED ORDER — HYDROCOD POLI-CHLORPHE POLI ER 10-8 MG/5ML PO SUER
5.0000 mL | Freq: Two times a day (BID) | ORAL | 0 refills | Status: DC | PRN
Start: 2023-10-10 — End: 2023-10-13

## 2023-10-10 MED ORDER — AZITHROMYCIN 250 MG PO TABS: ORAL_TABLET | ORAL | 0 refills | Status: DC

## 2023-10-10 NOTE — ED Triage Notes (Signed)
 Sx x 1 day   Cough Bodyaches Scratchy throat  Fever Headache

## 2023-10-10 NOTE — ED Provider Notes (Signed)
 [facility]    CSN: [id number] Arrival date & time: 10/10/23  1836      History   Chief Complaint Chief Complaint  Patient presents with   Cough   Fever   Generalized Body Aches   Headache    HPI Vicki Norris is a 83 y.o. female.   HPI  History obtained from the patient. Vicki Norris presents for sneezing, rhinorrhea, coughing, fatigue, body aches and headache since yesterday. Has a "tickle in my throat." Has headache above her eyes. No vomiting, diarrhea. Thinks she had a fever this afternoon as she got chills then was warm.  Her son has been sick too and told her she needed to go the the doctor to get checked out.    Didn't check her blood sugar today.  Last ate around 4 PM.  She takes glipizide.    Past Medical History:  Diagnosis Date   Anemia    COVID-19 04/2021   Depression    Diabetes mellitus without complication (HCC)    GERD (gastroesophageal reflux disease)    Head ache    Hypertension    Osteoarthritis    Stroke [facility])    TIA   TIA (transient ischemic attack)     Patient Active Problem List   Diagnosis Date Noted   Nonspecific syndrome suggestive of viral illness 05/01/2023   Suspected exposure to mold 05/01/2023   COVID-19 04/2021   Acute pain of right knee 01/22/2018   Osteopenia 09/11/2017   Presbyesophagus 09/11/2017   Primary osteoarthritis of right knee 10/26/2016   Other constipation 06/19/2016   Hypercholesterolemia 06/08/2016   New onset type 2 diabetes mellitus (HCC) 06/08/2016   Vitamin B 12 deficiency 06/08/2016   Recurrent major depressive disorder (HCC) 04/14/2016   Headache 09/20/2015   Depression 01/07/2014   GERD (gastroesophageal reflux disease) 01/07/2014    Past Surgical History:  Procedure Laterality Date   ABDOMINAL HYSTERECTOMY     CATARACT EXTRACTION Bilateral    COLONOSCOPY     COLONOSCOPY WITH PROPOFOL N/A 11/01/2017   Procedure: COLONOSCOPY WITH PROPOFOL;  Surgeon: Christena Deem, MD;  Location:  [facility] ENDOSCOPY;  Service: Endoscopy;  Laterality: N/A;   COLONOSCOPY WITH PROPOFOL N/A 01/03/2021   Procedure: COLONOSCOPY WITH PROPOFOL;  Surgeon: Regis Bill, MD;  Location: [facility] ENDOSCOPY;  Service: Endoscopy;  Laterality: N/A;   ESOPHAGOGASTRODUODENOSCOPY     ESOPHAGOGASTRODUODENOSCOPY (EGD) WITH PROPOFOL N/A 06/29/2016   Procedure: ESOPHAGOGASTRODUODENOSCOPY (EGD) WITH PROPOFOL;  Surgeon: Christena Deem, MD;  Location: [facility] ENDOSCOPY;  Service: Endoscopy;  Laterality: N/A;   ESOPHAGOGASTRODUODENOSCOPY (EGD) WITH PROPOFOL N/A 11/01/2017   Procedure: ESOPHAGOGASTRODUODENOSCOPY (EGD) WITH PROPOFOL;  Surgeon: Christena Deem, MD;  Location: [facility] ENDOSCOPY;  Service: Endoscopy;  Laterality: N/A;   ESOPHAGOGASTRODUODENOSCOPY (EGD) WITH PROPOFOL N/A 01/03/2021   Procedure: ESOPHAGOGASTRODUODENOSCOPY (EGD) WITH PROPOFOL;  Surgeon: Regis Bill, MD;  Location: [facility] ENDOSCOPY;  Service: Endoscopy;  Laterality: N/A;   ROTATOR CUFF REPAIR     TUBAL LIGATION      OB History   No obstetric history on file.      Home Medications    Prior to Admission medications   Medication Sig Start Date End Date Taking? Authorizing Provider  aspirin 81 MG chewable tablet Chew by mouth.   Yes [provider]  azithromycin (ZITHROMAX Z-PAK) 250 MG tablet Take 2 tablets on day 1 then 1 tablet daily 10/10/23  Yes Hau Sanor, DO  chlorpheniramine-HYDROcodone (TUSSIONEX) 10-8 MG/5ML Take 5 mLs by mouth every 12 (  twelve) hours as needed. 10/10/23  Yes Carlos Quackenbush, DO  glipiZIDE (GLUCOTROL) 10 MG tablet Take by mouth.   Yes [provider]  glipiZIDE (GLUCOTROL) 5 MG tablet 2 tabs po qam and 1 tab po qpm 12/05/22  Yes [provider]  rosuvastatin (CRESTOR) 5 MG tablet Take 5 mg by mouth daily.   Yes [provider]  ACCU-CHEK AVIVA PLUS test strip 1 each 3 (three) times daily. Last check, 12-23-2022, HS-Result 202 10/24/20   [provider]  Alcohol  Swabs (ALCOHOL PREP) 70 % PADS Use to check blood sugar twice daily. 12/14/21   [provider]  aspirin 81 MG tablet Take 81 mg by mouth daily.    [provider]  azelastine (ASTELIN) 0.1 % nasal spray Place into the nose. 11/08/20 11/08/21  [provider]  azelastine (ASTELIN) 0.1 % nasal spray 1 spray into each nostril two (2) times a day. Use in each nostril as directed 08/19/22 08/19/23  [provider]  benzonatate (TESSALON) 100 MG capsule Take 2 capsules (200 mg total) by mouth every 8 (eight) hours. 09/04/23   Becky Augusta, NP  cetirizine-pseudoephedrine (ZYRTEC-D) 5-120 MG tablet Take 1 tablet by mouth daily. 09/06/19   Verlee Monte, NP  cholecalciferol (VITAMIN D) 1000 units tablet Take 1,000 Units by mouth 2 (two) times daily.     [provider]  Cholecalciferol (VITAMIN D3) 25 MCG (1000 UT) CAPS Take by mouth.    [provider]  cyanocobalamin (VITAMIN B12) 1000 MCG tablet Take 1 tablet by mouth daily.    [provider]  cyanocobalamin 1000 MCG tablet Take 1,000 mcg by mouth daily.    [provider]  estradiol (ESTRACE) 0.1 MG/GM vaginal cream Place vaginally. 04/09/23 06/25/24  [provider]  famotidine (PEPCID) 40 MG tablet TAKE 1 TABLET BY MOUTH EVERYDAY AT BEDTIME 04/12/22   [provider]  fluconazole (DIFLUCAN) 150 MG tablet Take 1 tablet (150 mg total) by mouth every 3 (three) days as needed. 12/24/22   Bailey Mech, NP  fluticasone (FLONASE) 50 MCG/ACT nasal spray Place 1 spray into both nostrils daily. 07/13/22   Candis Schatz, PA-C  glipiZIDE (GLUCOTROL XL) 5 MG 24 hr tablet Take 5 mg by mouth daily. 11/19/20   [provider]  ipratropium (ATROVENT) 0.06 % nasal spray Place 2 sprays into both nostrils 4 (four) times daily. 09/04/23   Becky Augusta, NP  lactulose [facility]) 10 GM/15ML solution SMARTSIG:30 Milliliter(s) By Mouth Daily PRN 10/23/22   [provider]   Lancets [facility] [place] PLUS [place]) MISC  10/26/22   [provider]  Lancets MISC 2 (two) times daily. 12/14/21   [provider]  lansoprazole (PREVACID) 30 MG capsule Take 30 mg by mouth daily. 05/20/20   [provider]  meloxicam (MOBIC) 7.5 MG tablet Take 1 tablet (7.5 mg total) by mouth daily. 09/01/22   White, Elita Boone, NP  methylPREDNISolone (MEDROL DOSEPAK) 4 MG TBPK tablet See admin instructions. 08/19/22   [provider]  Milk Thistle Extract 175 MG TABS Take by mouth.    [provider]  mometasone (NASONEX) 50 MCG/ACT nasal spray 2 sprays 2 (two) times daily. 09/18/22   [provider]  Multiple Vitamins-Minerals (MULTIVITAMIN WITH MINERALS) tablet Take 1 tablet by mouth daily.    [provider]  nitrofurantoin, macrocrystal-monohydrate, (MACROBID) 100 MG capsule Take 1 capsule (100 mg total) by mouth 2 (two) times daily. 12/24/22   Bailey Mech, NP  ondansetron (ZOFRAN) 4 MG tablet Take 4 mg by mouth every 8 (eight) hours as needed. 11/16/20   [provider]  ondansetron (ZOFRAN-ODT) 4 MG disintegrating tablet Take 1 tablet (4 mg total) by mouth every 8 (eight) hours as needed for nausea or vomiting. 09/01/22   White, Elita Boone, NP  pantoprazole (PROTONIX) 20 MG tablet Take by mouth. 12/13/22 03/13/23  [provider]  phenazopyridine (PYRIDIUM) 200 MG tablet Take 1 tablet (200 mg total) by mouth 3 (three) times daily. 12/24/22   Bailey Mech, NP  predniSONE (DELTASONE) 5 MG tablet Take by mouth. 07/17/22   [provider]  promethazine-dextromethorphan (PROMETHAZINE-DM) 6.25-15 MG/5ML syrup Take 5 mLs by mouth 4 (four) times daily as needed. 09/04/23   Becky Augusta, NP  RABEprazole (ACIPHEX) 20 MG tablet Take 1 tablet by mouth 2 (two) times daily. 05/15/23   [provider]  rosuvastatin (CRESTOR) 5 MG tablet Take 1 tablet by mouth daily. 11/20/22   [provider]  solifenacin  (VESICARE) 10 MG tablet Take 10 mg by mouth daily. 11/09/22   [provider]  solifenacin (VESICARE) 5 MG tablet Take 1 tablet by mouth daily. 04/25/22   [provider]  tamsulosin (FLOMAX) 0.4 MG CAPS capsule Take 1 capsule (0.4 mg total) by mouth daily. 09/01/22   White, Elita Boone, NP  XIIDRA 5 % SOLN Apply 1 drop to eye 2 (two) times daily. 05/10/23   [provider]  Esomeprazole Magnesium (NEXIUM PO) Take 22.3 mg by mouth.  09/06/19  [provider]  sodium chloride (OCEAN) 0.65 % SOLN nasal spray Place 2 sprays into both nostrils every 2 (two) hours while awake. 09/12/15 09/06/19  Betancourt, Jarold Song, NP    Family History Family History  Problem Relation Age of Onset   Breast cancer Sister 30    Social History Social History   Tobacco Use   Smoking status: Never   Smokeless tobacco: Never  Vaping Use   Vaping status: Never Used  Substance Use Topics   Alcohol use: No    Alcohol/week: 1.0 standard drink of alcohol    Types: 1 Cans of beer per week   Drug use: No     Allergies   Bupropion, Citalopram, Simvastatin, Esomeprazole magnesium, Metformin and related, Pantoprazole, and Prozac [fluoxetine]   Review of Systems Review of Systems: negative unless otherwise stated in HPI.      Physical Exam Triage Vital Signs ED Triage Vitals  Encounter Vitals Group     BP 10/10/23 1847 (!) 143/86     Systolic BP Percentile --      Diastolic BP Percentile --      Pulse Rate 10/10/23 1847 77     Resp 10/10/23 1847 17     Temp 10/10/23 1847 98.5 F (36.9 C)     Temp Source 10/10/23 1847 Oral     SpO2 10/10/23 1847 97 %     Weight --      Height --      Head Circumference --      Peak Flow --      Pain Score 10/10/23 1846 8     Pain Loc --      Pain Education --      Exclude from Growth Chart --    No data found.  Updated Vital Signs BP (!) 143/86 (BP Location: Left Arm)   Pulse 77   Temp 98.5 F (36.9 C) (Oral)   Resp 17   SpO2  97%   Visual Acuity Right Eye Distance:   Left Eye Distance:   Bilateral Distance:    Right Eye Near:   Left Eye Near:    Bilateral Near:     Physical Exam GEN:     alert, non-toxic appearing female in no distress    HENT:  mucus membranes moist, oropharyngeal without lesions or erythema, no tonsillar hypertrophy or exudates,  moderate erythematous edematous turbinates, clear nasal discharge, bilateral TM normal EYES:   pupils equal and reactive, no scleral injection or discharge NECK:  normal ROM, no meningismus   RESP:  no increased work of breathing, clear to auscultation bilaterally CVS:   regular rate and rhythm Skin:   warm and dry, no rash on visible skin    UC Treatments / Results  Labs (all labs ordered are listed, but only abnormal results are displayed) Labs Reviewed  GLUCOSE, CAPILLARY - Abnormal; Notable for the following components:      Result Value   Glucose-Capillary 112 (*)    All other components within normal limits  RESP PANEL BY RT-PCR (RSV, FLU A&B, COVID)  RVPGX2  CBG MONITORING, ED    EKG   Radiology No results found.  Procedures Procedures (including critical care time)  Medications Ordered in UC Medications - No data to display  Initial Impression / Assessment and Plan / UC Course  I have reviewed the triage vital signs and the nursing notes.  Pertinent labs & imaging results that were available during my care of the patient were reviewed by me and considered in my medical decision making (see chart for details).       Pt is a 83 y.o. female who presents for 1 day of respiratory symptoms. Adyline is afebrile here without recent antipyretics. Satting well on room air. Overall pt is non-toxic appearing, well hydrated, without respiratory distress. Pulmonary exam is unremarkable.  Chest xray declined.  COVID, RSV and influenza panel obtained and was negative. History consistent with viral respiratory illness. Discussed symptomatic  treatment.  Explained lack of efficacy of antibiotics in viral disease.  Pt started crying has her son needed antibiotics to get rid of his symptoms. Pt worrying about health. Typical duration of symptoms discussed. Azithromycin prescribed for delayed use. Tussionex for cough.   Return and ED precautions given and voiced understanding. Discussed MDM, treatment plan and plan for follow-up with patient who agrees with plan.     Final Clinical Impressions(s) / UC Diagnoses   Final diagnoses:  Upper respiratory tract infection, unspecified type  Acute cough     Discharge Instructions      Stop by the pharmacy to pick up your prescriptions.  Follow up with your primary care provider or return to the urgent care, if not improving.       ED Prescriptions     Medication Sig Dispense Auth. Provider   chlorpheniramine-HYDROcodone (TUSSIONEX) 10-8 MG/5ML Take 5 mLs by mouth every 12 (twelve) hours as needed. 115 mL Eldena Dede, DO   azithromycin (ZITHROMAX Z-PAK) 250 MG tablet Take 2 tablets on day 1 then 1 tablet daily 6 tablet Ilina Xu, DO      I have reviewed the PDMP during this encounter.   Katha Cabal, DO 10/10/23 1949

## 2023-10-10 NOTE — Discharge Instructions (Signed)
 Stop by the pharmacy to pick up your prescriptions.  Follow up with your primary care provider or return to the urgent care, if not improving.

## 2023-10-13 ENCOUNTER — Ambulatory Visit: Admission: RE | Admit: 2023-10-13 | Source: Ambulatory Visit

## 2023-10-13 ENCOUNTER — Ambulatory Visit
Admission: EM | Admit: 2023-10-13 | Discharge: 2023-10-13 | Disposition: A | Discharge status: Home / Self Care | Attending: Emergency Medicine | Admitting: Emergency Medicine

## 2023-10-13 ENCOUNTER — Ambulatory Visit

## 2023-10-13 ENCOUNTER — Encounter: Payer: Self-pay | Admitting: Emergency Medicine

## 2023-10-13 DIAGNOSIS — J101 Influenza due to other identified influenza virus with other respiratory manifestations: Secondary | ICD-10-CM

## 2023-10-13 DIAGNOSIS — R051 Acute cough: Secondary | ICD-10-CM

## 2023-10-13 MED ORDER — OSELTAMIVIR PHOSPHATE 75 MG PO CAPS: 75.0000 mg | ORAL_CAPSULE | Freq: Two times a day (BID) | ORAL | 0 refills | Status: DC

## 2023-10-13 MED ORDER — PROMETHAZINE-DM 6.25-15 MG/5ML PO SYRP: 5.0000 mL | ORAL_SOLUTION | Freq: Four times a day (QID) | ORAL | 0 refills | Status: DC | PRN

## 2023-10-13 NOTE — ED Provider Notes (Signed)
 [facility] [place]   Name: Vicki Norris DOB: 1941/03/22 MRN: [id number] CSN: [id number] PCP: Marina Goodell, MD  Arrival date and time:  10/13/23 0858  Chief Complaint:  Cough   NOTE: Prior to seeing the patient today, I have reviewed the triage nursing documentation and vital signs. Clinical staff has updated patient's PMH/PSHx, current medication list, and drug allergies/intolerances to ensure comprehensive history available to assist in medical decision making.   History:   HPI: Vicki Norris is a 83 y.o. female who presents today alone with complaints of continued cough and bodyaches.  Patient has been seen 3 times over the past 72 hours with similar complaints.  At the first visit here in this facility, flu test resulted as negative, but she was prescribed azithromycin and prednisone to treat bacterial bronchitis.  She felt her symptoms did not improve over 24 hours and had concerns about her blood sugar dropping, therefore she presented to [facility] ED for further evaluation.  No further medications were given, but a full respiratory panel was completed and tested positive for influenza A.  Upon chart review and patient recollection, she was not given any medication for this flu diagnosis.  She was however discharged with prescription strength cough medication.  Upon awakening this morning, she noticed worsening body aches, feelings of fever, and lack of appetite.  She also endorses diarrhea, and coughing.   Past Medical History:  Diagnosis Date   Anemia    COVID-19 04/2021   Depression    Diabetes mellitus without complication (HCC)    GERD (gastroesophageal reflux disease)    Head ache    Hypertension    Osteoarthritis    Stroke [facility])    TIA   TIA (transient ischemic attack)     Past Surgical History:  Procedure Laterality Date   ABDOMINAL HYSTERECTOMY     CATARACT EXTRACTION Bilateral    COLONOSCOPY     COLONOSCOPY WITH PROPOFOL N/A  11/01/2017   Procedure: COLONOSCOPY WITH PROPOFOL;  Surgeon: Christena Deem, MD;  Location: [facility] ENDOSCOPY;  Service: Endoscopy;  Laterality: N/A;   COLONOSCOPY WITH PROPOFOL N/A 01/03/2021   Procedure: COLONOSCOPY WITH PROPOFOL;  Surgeon: Regis Bill, MD;  Location: [facility] ENDOSCOPY;  Service: Endoscopy;  Laterality: N/A;   ESOPHAGOGASTRODUODENOSCOPY     ESOPHAGOGASTRODUODENOSCOPY (EGD) WITH PROPOFOL N/A 06/29/2016   Procedure: ESOPHAGOGASTRODUODENOSCOPY (EGD) WITH PROPOFOL;  Surgeon: Christena Deem, MD;  Location: [facility] ENDOSCOPY;  Service: Endoscopy;  Laterality: N/A;   ESOPHAGOGASTRODUODENOSCOPY (EGD) WITH PROPOFOL N/A 11/01/2017   Procedure: ESOPHAGOGASTRODUODENOSCOPY (EGD) WITH PROPOFOL;  Surgeon: Christena Deem, MD;  Location: [facility] ENDOSCOPY;  Service: Endoscopy;  Laterality: N/A;   ESOPHAGOGASTRODUODENOSCOPY (EGD) WITH PROPOFOL N/A 01/03/2021   Procedure: ESOPHAGOGASTRODUODENOSCOPY (EGD) WITH PROPOFOL;  Surgeon: Regis Bill, MD;  Location: [facility] ENDOSCOPY;  Service: Endoscopy;  Laterality: N/A;   ROTATOR CUFF REPAIR     TUBAL LIGATION      Family History  Problem Relation Age of Onset   Breast cancer Sister 11    Social History   Tobacco Use   Smoking status: Never   Smokeless tobacco: Never  Vaping Use   Vaping status: Never Used  Substance Use Topics   Alcohol use: No    Alcohol/week: 1.0 standard drink of alcohol    Types: 1 Cans of beer per week   Drug use: No    Patient Active Problem List   Diagnosis Date Noted   Nonspecific syndrome suggestive of viral  illness 05/01/2023   Suspected exposure to mold 05/01/2023   COVID-19 04/2021   Acute pain of right knee 01/22/2018   Osteopenia 09/11/2017   Presbyesophagus 09/11/2017   Primary osteoarthritis of right knee 10/26/2016   Other constipation 06/19/2016   Hypercholesterolemia 06/08/2016   New onset type 2 diabetes mellitus (HCC) 06/08/2016   Vitamin B 12 deficiency 06/08/2016   Recurrent  major depressive disorder (HCC) 04/14/2016   Headache 09/20/2015   Depression 01/07/2014   GERD (gastroesophageal reflux disease) 01/07/2014    Home Medications:    No outpatient medications have been marked as taking for the 10/13/23 encounter [facility] Encounter).    Allergies:   Bupropion, Citalopram, Simvastatin, Esomeprazole magnesium, Metformin and related, Pantoprazole, and Prozac [fluoxetine]  Review of Systems (ROS): Review of Systems  Constitutional:  Positive for appetite change, chills and fatigue. Negative for activity change and diaphoresis.  HENT:  Positive for postnasal drip and sore throat. Negative for congestion, sinus pain and sneezing.   Respiratory:  Positive for cough and shortness of breath. Negative for wheezing.   Cardiovascular:  Negative for chest pain.  Gastrointestinal:  Positive for diarrhea and nausea. Negative for vomiting.  Neurological:  Negative for dizziness and headaches.  All other systems reviewed and are negative.    Vital Signs: Today's Vitals   10/13/23 0927 10/13/23 0928 10/13/23 0930  BP:   103/68  Pulse:   92  Resp:   15  Temp:   99 F (37.2 C)  TempSrc:   Oral  SpO2:   95%  Weight:  186 lb 1.1 oz (84.4 kg)   Height:  5\' 9"  (1.753 m)   PainSc: 10-Worst pain ever      Physical Exam: Physical Exam Vitals and nursing note reviewed.  Constitutional:      General: She is not in acute distress.    Appearance: Normal appearance. She is ill-appearing. She is not toxic-appearing.  HENT:     Right Ear: Tympanic membrane normal.     Left Ear: Tympanic membrane normal.     Mouth/Throat:     Pharynx: Oropharynx is clear. No posterior oropharyngeal erythema.  Cardiovascular:     Rate and Rhythm: Normal rate and regular rhythm.     Pulses: Normal pulses.     Heart sounds: Normal heart sounds.  Pulmonary:     Breath sounds: Normal breath sounds.  Skin:    General: Skin is warm and dry.  Neurological:     Mental Status: She is  alert.  Psychiatric:        Mood and Affect: Mood normal.        Behavior: Behavior normal.        Thought Content: Thought content normal.      Urgent Care Treatments / Results:   LABS: PLEASE NOTE: all labs that were ordered this encounter are listed, however only abnormal results are displayed. Labs Reviewed - No data to display  EKG: -None  RADIOLOGY: No results found.  PROCEDURES: Procedures  MEDICATIONS RECEIVED THIS VISIT: Medications - No data to display  PERTINENT CLINICAL COURSE NOTES/UPDATES:   Initial Impression / Assessment and Plan / Urgent Care Course:  Pertinent labs & imaging results that were available during my care of the patient were personally reviewed by me and considered in my medical decision making (see lab/imaging section of note for values and interpretations).  Vicki Norris is a 83 y.o. female who presents to [facility] today with complaints of cough body aches  and known influenza a diagnosis, diagnosed with influenza A, and treated as such with the medications below. NP and patient reviewed discharge instructions below during visit.   Patient is well appearing overall in clinic today. She does not appear to be in any acute distress. Presenting symptoms (see HPI) and exam as documented above.   I have reviewed the follow up and strict return precautions for any new or worsening symptoms. Patient is aware of symptoms that would be deemed urgent/emergent, and would thus require further evaluation either here or in the emergency department. At the time of discharge, she verbalized understanding and consent with the discharge plan as it was reviewed with her. All questions were fielded by provider and/or clinic staff prior to patient discharge.    Final Clinical Impressions / Urgent Care Diagnoses:   Final diagnoses:  Acute cough  Influenza A    New Prescriptions:  [place] Controlled Substance Registry consulted? Not Applicable  Meds  ordered this encounter  Medications   promethazine-dextromethorphan (PROMETHAZINE-DM) 6.25-15 MG/5ML syrup    Sig: Take 5 mLs by mouth 4 (four) times daily as needed for cough.    Dispense:  118 mL    Refill:  0   oseltamivir (TAMIFLU) 75 MG capsule    Sig: Take 1 capsule (75 mg total) by mouth every 12 (twelve) hours.    Dispense:  10 capsule    Refill:  0      Discharge Instructions      You were seen for cough and are being treated for influenza, the flu.   -You are being prescribed Tamiflu.  Take as directed. -You are being prescribed a new cough syrup.  Stop taking your previously prescribed cough syrup and start using your new prescription as directed, no more than 4 times a day. -Treat your body aches and fevers with Tylenol. -Rest and hydrate. -Follow-up if your symptoms worsen or do not improve after completing Tamiflu.  Take care, Dr. Sharlet Salina, NP-c      Recommended Follow up Care:  Patient encouraged to follow up with the following provider within the specified time frame, or sooner as dictated by the severity of her symptoms. As always, she was instructed that for any urgent/emergent care needs, she should seek care either here or in the emergency department for more immediate evaluation.   Bailey Mech, DNP, NP-c   Bailey Mech, NP 10/13/23 1128

## 2023-10-13 NOTE — ED Triage Notes (Signed)
 Patient reports that she was seen here on 10/10/23 and given Z-Pack and Codeine cough medicine.  Patient states that she is not feeling better and cough is getting worse.

## 2023-10-13 NOTE — Discharge Instructions (Signed)
 You were seen for cough and are being treated for influenza, the flu.   -You are being prescribed Tamiflu.  Take as directed. -You are being prescribed a new cough syrup.  Stop taking your previously prescribed cough syrup and start using your new prescription as directed, no more than 4 times a day. -Treat your body aches and fevers with Tylenol. -Rest and hydrate. -Follow-up if your symptoms worsen or do not improve after completing Tamiflu.  Take care, Dr. Sharlet Salina, NP-c

## 2024-01-23 ENCOUNTER — Ambulatory Visit
Admission: EM | Admit: 2024-01-23 | Discharge: 2024-01-23 | Disposition: A | Discharge status: Home / Self Care | Attending: Physician Assistant | Admitting: Physician Assistant

## 2024-01-23 DIAGNOSIS — J019 Acute sinusitis, unspecified: Secondary | ICD-10-CM | POA: Diagnosis not present

## 2024-01-23 DIAGNOSIS — R519 Headache, unspecified: Secondary | ICD-10-CM | POA: Insufficient documentation

## 2024-01-23 DIAGNOSIS — R52 Pain, unspecified: Secondary | ICD-10-CM

## 2024-01-23 DIAGNOSIS — R051 Acute cough: Secondary | ICD-10-CM | POA: Insufficient documentation

## 2024-01-23 DIAGNOSIS — J029 Acute pharyngitis, unspecified: Secondary | ICD-10-CM | POA: Diagnosis not present

## 2024-01-23 DIAGNOSIS — R5383 Other fatigue: Secondary | ICD-10-CM

## 2024-01-23 LAB — SARS CORONAVIRUS 2 BY RT PCR: SARS Coronavirus 2 by RT PCR: NEGATIVE

## 2024-01-23 LAB — GROUP A STREP BY PCR: Group A Strep by PCR: NOT DETECTED

## 2024-01-23 MED ORDER — KETOROLAC TROMETHAMINE 60 MG/2ML IM SOLN
30.0000 mg | Freq: Once | INTRAMUSCULAR | Status: AC
  Administered 2024-01-23: 30 mg via INTRAMUSCULAR

## 2024-01-23 MED ORDER — CELECOXIB 100 MG PO CAPS: 100.0000 mg | ORAL_CAPSULE | Freq: Two times a day (BID) | ORAL | 0 refills | Status: AC | PRN

## 2024-01-23 MED ORDER — DOXYCYCLINE HYCLATE 100 MG PO CAPS: 100.0000 mg | ORAL_CAPSULE | Freq: Two times a day (BID) | ORAL | 0 refills | Status: AC

## 2024-01-23 MED ORDER — PROMETHAZINE-DM 6.25-15 MG/5ML PO SYRP: 5.0000 mL | ORAL_SOLUTION | Freq: Four times a day (QID) | ORAL | 0 refills | Status: DC | PRN

## 2024-01-23 NOTE — Discharge Instructions (Addendum)
-  Negative COVID and strep - Your headache is consistent with a tension type headache.  See handout. - You can apply heat to the area, muscle rubs, lidocaine  patches and take aspirin and Tylenol .  You can also take your home muscle relaxer.  Stretch your neck.  Avoid painful activities.  Rest. -If your headache becomes more severe and you have any symptoms like dizziness, weakness, nausea/vomiting, feeling faint or passing out, confusion, tingling or numbness, speech or balance problems, you need to call 911 or have someone take to the ER. - If you are still having these headaches next week, contact her PCP.

## 2024-01-23 NOTE — ED Triage Notes (Signed)
 Headache that radiates down to collar bone. Sensitivity to light. Bodyaches right ear pain Sore throat-cough   Sx x 4 days

## 2024-01-23 NOTE — ED Provider Notes (Signed)
 [facility]    CSN: [id number] Arrival date & time: 01/23/24  1437      History   Chief Complaint Chief Complaint  Patient presents with   Headache   Sore Throat   Otalgia   Cough    HPI Vicki Norris is a 83 y.o. female presenting for concerns regarding right-sided neck pain and occipital headache that has been coming and going over the past 5 days.  Patient also reports increased sneezing and feeling little achy in her body.  Additionally she has had cough, sinus pressure, and throat irritation x 3 days. She denies any associated fevers, chest pain, breathing difficulty, nausea/vomiting or diarrhea.  No sick contacts.  She would like to be tested for COVID-19.  She reports she has taken Tylenol  but it has not helped the headache which is currently 8/10.  Patient says her scalp is almost tender and so is her neck.  A little bit of increased pain when she moves her neck.  No radiation of pain down the extremities or numbness/tingling or weakness.  No associated dizziness, confusion, speech or balance issues.  Patient denies history of stroke but she does have history of TIA in her chart.  Patient has no other complaints today.   HPI  Past Medical History:  Diagnosis Date   Anemia    COVID-19 04/2021   Depression    Diabetes mellitus without complication (HCC)    GERD (gastroesophageal reflux disease)    Head ache    Hypertension    Osteoarthritis    Stroke [facility])    TIA   TIA (transient ischemic attack)     Patient Active Problem List   Diagnosis Date Noted   Nonspecific syndrome suggestive of viral illness 05/01/2023   Suspected exposure to mold 05/01/2023   COVID-19 04/2021   Acute pain of right knee 01/22/2018   Osteopenia 09/11/2017   Presbyesophagus 09/11/2017   Primary osteoarthritis of right knee 10/26/2016   Other constipation 06/19/2016   Hypercholesterolemia 06/08/2016   New onset type 2 diabetes mellitus (HCC) 06/08/2016   Vitamin B 12  deficiency 06/08/2016   Recurrent major depressive disorder (HCC) 04/14/2016   Headache 09/20/2015   Depression 01/07/2014   GERD (gastroesophageal reflux disease) 01/07/2014    Past Surgical History:  Procedure Laterality Date   ABDOMINAL HYSTERECTOMY     CATARACT EXTRACTION Bilateral    COLONOSCOPY     COLONOSCOPY WITH PROPOFOL  N/A 11/01/2017   Procedure: COLONOSCOPY WITH PROPOFOL ;  Surgeon: Deveron Fly, MD;  Location: [facility] ENDOSCOPY;  Service: Endoscopy;  Laterality: N/A;   COLONOSCOPY WITH PROPOFOL  N/A 01/03/2021   Procedure: COLONOSCOPY WITH PROPOFOL ;  Surgeon: Shane Darling, MD;  Location: [facility] ENDOSCOPY;  Service: Endoscopy;  Laterality: N/A;   ESOPHAGOGASTRODUODENOSCOPY     ESOPHAGOGASTRODUODENOSCOPY (EGD) WITH PROPOFOL  N/A 06/29/2016   Procedure: ESOPHAGOGASTRODUODENOSCOPY (EGD) WITH PROPOFOL ;  Surgeon: Deveron Fly, MD;  Location: [facility] ENDOSCOPY;  Service: Endoscopy;  Laterality: N/A;   ESOPHAGOGASTRODUODENOSCOPY (EGD) WITH PROPOFOL  N/A 11/01/2017   Procedure: ESOPHAGOGASTRODUODENOSCOPY (EGD) WITH PROPOFOL ;  Surgeon: Deveron Fly, MD;  Location: [facility] ENDOSCOPY;  Service: Endoscopy;  Laterality: N/A;   ESOPHAGOGASTRODUODENOSCOPY (EGD) WITH PROPOFOL  N/A 01/03/2021   Procedure: ESOPHAGOGASTRODUODENOSCOPY (EGD) WITH PROPOFOL ;  Surgeon: Shane Darling, MD;  Location: [facility] ENDOSCOPY;  Service: Endoscopy;  Laterality: N/A;   ROTATOR CUFF REPAIR     TUBAL LIGATION      OB History   No obstetric history on file.  Home Medications    Prior to Admission medications   Medication Sig Start Date End Date Taking? Authorizing Provider  aspirin 81 MG chewable tablet Chew by mouth.   Yes [provider]  celecoxib (CELEBREX) 100 MG capsule Take 1 capsule (100 mg total) by mouth 2 (two) times daily as needed for moderate pain (pain score 4-6). 01/23/24  Yes Floydene Hy, PA-C  doxycycline  (VIBRAMYCIN ) 100 MG capsule Take 1 capsule (100 mg total)  by mouth 2 (two) times daily for 7 days. 01/23/24 01/30/24 Yes Nancy Axon B, PA-C  empagliflozin (JARDIANCE) 10 MG TABS tablet Take 1 tablet by mouth daily. 01/03/24  Yes [provider]  glipiZIDE (GLUCOTROL) 10 MG tablet Take by mouth.   Yes [provider]  Milk Thistle Extract 175 MG TABS Take by mouth.   Yes [provider]  promethazine -dextromethorphan (PROMETHAZINE -DM) 6.25-15 MG/5ML syrup Take 5 mLs by mouth 4 (four) times daily as needed. 01/23/24  Yes Floydene Hy, PA-C  RABEprazole (ACIPHEX) 20 MG tablet Take 1 tablet by mouth 2 (two) times daily. 05/15/23  Yes [provider]  repaglinide (PRANDIN) 0.5 MG tablet Take by mouth. 01/18/24  Yes [provider]  sucralfate (CARAFATE) 1 GM/10ML suspension SMARTSIG:Milliliter(s) By Mouth 08/04/23  Yes [provider]  ACCU-CHEK AVIVA PLUS test strip 1 each 3 (three) times daily. Last check, 12-23-2022, HS-Result 202 10/24/20   [provider]  Alcohol Swabs (ALCOHOL PREP) 70 % PADS Use to check blood sugar twice daily. 12/14/21   [provider]  azelastine (ASTELIN) 0.1 % nasal spray Place into the nose. 11/08/20 11/08/21  [provider]  azelastine (ASTELIN) 0.1 % nasal spray 1 spray into each nostril two (2) times a day. Use in each nostril as directed 08/19/22 08/19/23  [provider]  cholecalciferol (VITAMIN D) 1000 units tablet Take 1,000 Units by mouth 2 (two) times daily.     [provider]  cyanocobalamin (VITAMIN B12) 1000 MCG tablet Take 1 tablet by mouth daily.    [provider]  estradiol (ESTRACE) 0.1 MG/GM vaginal cream Place vaginally. 04/09/23 06/25/24  [provider]  famotidine (PEPCID) 40 MG tablet TAKE 1 TABLET BY MOUTH EVERYDAY AT BEDTIME 04/12/22   [provider]  fluticasone  (FLONASE ) 50 MCG/ACT nasal spray Place 1 spray into both nostrils daily. 07/13/22   Harris, Michael D, PA-C  glipiZIDE  (GLUCOTROL) 5 MG tablet 2 tabs po qam and 1 tab po qpm 12/05/22   [provider]  lactulose (CHRONULAC) 10 GM/15ML solution SMARTSIG:30 Milliliter(s) By Mouth Daily PRN 10/23/22   [provider]  Lancets MISC 2 (two) times daily. 12/14/21   [provider]  lansoprazole (PREVACID) 30 MG capsule Take 30 mg by mouth daily. 05/20/20   [provider]  metFORMIN (GLUCOPHAGE-XR) 500 MG 24 hr tablet Take 500 mg by mouth 2 (two) times daily.    [provider]  mometasone (NASONEX) 50 MCG/ACT nasal spray 2 sprays 2 (two) times daily. 09/18/22   [provider]  Multiple Vitamins-Minerals (MULTIVITAMIN WITH MINERALS) tablet Take 1 tablet by mouth daily.    [provider]  pantoprazole (PROTONIX) 20 MG tablet Take by mouth. 12/13/22 03/13/23  [provider]  predniSONE  (DELTASONE ) 5 MG tablet Take by mouth. 07/17/22   [provider]  solifenacin (VESICARE) 10 MG tablet Take 10 mg by mouth daily. 11/09/22   [provider]  solifenacin (VESICARE) 5 MG tablet Take 1 tablet by  mouth daily. 04/25/22   [provider]  tamsulosin  (FLOMAX ) 0.4 MG CAPS capsule Take 1 capsule (0.4 mg total) by mouth daily. 09/01/22   White, Maybelle Spatz, NP  XIIDRA 5 % SOLN Apply 1 drop to eye 2 (two) times daily. 05/10/23   [provider]  Esomeprazole Magnesium (NEXIUM PO) Take 22.3 mg by mouth.  09/06/19  [provider]  sodium chloride  (OCEAN) 0.65 % SOLN nasal spray Place 2 sprays into both nostrils every 2 (two) hours while awake. 09/12/15 09/06/19  Betancourt, Cleotis Daily, NP    Family History Family History  Problem Relation Age of Onset   Breast cancer Sister 19    Social History Social History   Tobacco Use   Smoking status: Never   Smokeless tobacco: Never  Vaping Use   Vaping status: Never Used  Substance Use Topics   Alcohol use: No    Alcohol/week: 1.0 standard drink of alcohol    Types: 1 Cans of beer  per week   Drug use: No     Allergies   Bupropion, Citalopram, Simvastatin, Esomeprazole magnesium, Metformin and related, Pantoprazole, Prozac [fluoxetine], and Glipizide   Review of Systems Review of Systems  Constitutional:  Positive for fatigue. Negative for chills, diaphoresis and fever.  HENT:  Positive for congestion, sinus pressure, sneezing and sore throat. Negative for ear pain and rhinorrhea.   Respiratory:  Positive for cough. Negative for shortness of breath.   Cardiovascular:  Negative for chest pain.  Gastrointestinal:  Negative for abdominal pain, nausea and vomiting.  Musculoskeletal:  Positive for myalgias and neck pain. Negative for arthralgias.  Skin:  Negative for rash.  Neurological:  Positive for headaches. Negative for dizziness and weakness.  Hematological:  Negative for adenopathy.     Physical Exam Triage Vital Signs  No data found.  Updated Vital Signs BP 134/80 (BP Location: Right Arm)   Pulse 79   Temp 98.6 F (37 C) (Oral)   Resp 16   SpO2 94%       Physical Exam Vitals and nursing note reviewed.  Constitutional:      General: She is not in acute distress.    Appearance: Normal appearance. She is not ill-appearing or toxic-appearing.  HENT:     Head: Normocephalic and atraumatic.     Right Ear: Ear canal and external ear normal. A middle ear effusion is present.     Left Ear: Tympanic membrane, ear canal and external ear normal.     Nose: Nose normal.     Mouth/Throat:     Mouth: Mucous membranes are moist.     Pharynx: Oropharynx is clear.  Eyes:     General: No scleral icterus.       Right eye: No discharge.        Left eye: No discharge.     Extraocular Movements: Extraocular movements intact.     Conjunctiva/sclera: Conjunctivae normal.     Pupils: Pupils are equal, round, and reactive to light.  Cardiovascular:     Rate and Rhythm: Normal rate and regular rhythm.     Heart sounds: Normal heart sounds.  Pulmonary:      Effort: Pulmonary effort is normal. No respiratory distress.     Breath sounds: Normal breath sounds.  Musculoskeletal:     Cervical back: Normal range of motion and neck supple. Tenderness (mild/moderate TTP along right enitre SCM, right paracervical muscles and mildly of occipital scalp) present. No rigidity.  Skin:    General:  Skin is dry.     Comments: No rashes or skin lesions.  Neurological:     General: No focal deficit present.     Mental Status: She is alert and oriented to person, place, and time. Mental status is at baseline.     Cranial Nerves: No cranial nerve deficit.     Motor: No weakness.     Coordination: Coordination normal.     Gait: Gait normal.     Comments: 5 out of 5 strength bilateral upper and lower extremities  Psychiatric:        Mood and Affect: Mood normal.        Behavior: Behavior normal.        Thought Content: Thought content normal.      UC Treatments / Results  Labs (all labs ordered are listed, but only abnormal results are displayed) Labs Reviewed  GROUP A STREP BY PCR  SARS CORONAVIRUS 2 BY RT PCR     EKG   Radiology No results found.  Procedures Procedures (including critical care time)  Medications Ordered in UC Medications  ketorolac (TORADOL) injection 30 mg (30 mg Intramuscular Given 01/23/24 1527)    Initial Impression / Assessment and Plan / UC Course  I have reviewed the triage vital signs and the nursing notes.  Pertinent labs & imaging results that were available during my care of the patient were reviewed by me and considered in my medical decision making (see chart for details).  83 year old female presenting for right-sided headache and neck pain that have been coming and going for the past 5 days.  Other symptoms may include scalp tenderness, scratchy/irritated throat, cough, sneezing, sinus pressure and body aches.  The vitals are normal and stable patient is overall well-appearing. +nasal congestion. Throat  clear. Current 8/10 headache.  Cranial nerve exam is within normal limits and 5 out of 5 strength bilateral upper and lower extremities.  Normal mood and thought content and behavior.  She does have tenderness palpation of the entire right SCM and right paracervical muscles as well as occipital scalp.  Slightly increased discomfort when she rotates her neck but no stiffness.  Chest clear to auscultation heart regular rate and rhythm.  COVID and strep obtained and negative.  Patient was given 30 mg IM ketorolac and she now reports 3 out of 10 headache.  Advised patient symptoms consistent with tension type headache.  Patient believes her symptoms are potentially related to a sinus infection.  She says she normally does better when she takes doxycycline  and requests to have that.  Explained to her that she has only been ill for 5 days and usually do not treat without until it has been 10 days or longer.  I did send doxycycline  to pharmacy but encouraged her to hold off for the next 3 to 5 days unless symptoms worsen.  Supportive care encouraged.  Sent celecoxib as needed for headaches since the ketorolac helped.  Advised to continue Tylenol  and try heat.  I suggested a mild muscle relaxer but she says she has some at home that she will try.  Advised stretches, muscle rubs and lidocaine  patches and close monitoring.  For any worsening of her neck pain or red flag signs and symptoms, she should go to emergency department.  I suspect the cough and throat irritation is related to her GERD.  Sneezing likely related to allergies.   Final Clinical Impressions(s) / UC Diagnoses   Final diagnoses:  Acute sinusitis, recurrence not specified,  unspecified location  Acute cough  Sore throat  Acute nonintractable headache, unspecified headache type     Discharge Instructions      -Negative COVID and strep - Your headache is consistent with a tension type headache.  See handout. - You can apply heat to the  area, muscle rubs, lidocaine  patches and take aspirin and Tylenol .  You can also take your home muscle relaxer.  Stretch your neck.  Avoid painful activities.  Rest. -If your headache becomes more severe and you have any symptoms like dizziness, weakness, nausea/vomiting, feeling faint or passing out, confusion, tingling or numbness, speech or balance problems, you need to call 911 or have someone take to the ER. - If you are still having these headaches next week, contact her PCP.    ED Prescriptions     Medication Sig Dispense Auth. Provider   doxycycline  (VIBRAMYCIN ) 100 MG capsule Take 1 capsule (100 mg total) by mouth 2 (two) times daily for 7 days. 14 capsule Nancy Axon B, PA-C   promethazine -dextromethorphan (PROMETHAZINE -DM) 6.25-15 MG/5ML syrup Take 5 mLs by mouth 4 (four) times daily as needed. 118 mL Nancy Axon B, PA-C   celecoxib (CELEBREX) 100 MG capsule Take 1 capsule (100 mg total) by mouth 2 (two) times daily as needed for moderate pain (pain score 4-6). 15 capsule Floydene Hy, PA-C      PDMP not reviewed this encounter.      Floydene Hy, PA-C 01/23/24 1609

## 2024-05-27 ENCOUNTER — Ambulatory Visit: Admission: EM | Admit: 2024-05-27 | Discharge: 2024-05-27 | Disposition: A | Discharge status: Home / Self Care

## 2024-05-27 ENCOUNTER — Encounter: Payer: Self-pay | Admitting: Emergency Medicine

## 2024-05-27 ENCOUNTER — Ambulatory Visit (INDEPENDENT_AMBULATORY_CARE_PROVIDER_SITE_OTHER)

## 2024-05-27 DIAGNOSIS — J440 Chronic obstructive pulmonary disease with acute lower respiratory infection: Secondary | ICD-10-CM

## 2024-05-27 DIAGNOSIS — R051 Acute cough: Secondary | ICD-10-CM | POA: Diagnosis not present

## 2024-05-27 DIAGNOSIS — J069 Acute upper respiratory infection, unspecified: Secondary | ICD-10-CM

## 2024-05-27 DIAGNOSIS — J209 Acute bronchitis, unspecified: Secondary | ICD-10-CM | POA: Diagnosis not present

## 2024-05-27 MED ORDER — PROMETHAZINE-DM 6.25-15 MG/5ML PO SYRP: 5.0000 mL | ORAL_SOLUTION | Freq: Four times a day (QID) | ORAL | 0 refills | Status: AC | PRN

## 2024-05-27 MED ORDER — ALBUTEROL SULFATE HFA 108 (90 BASE) MCG/ACT IN AERS: 2.0000 | INHALATION_SPRAY | RESPIRATORY_TRACT | 0 refills | Status: AC | PRN

## 2024-05-27 MED ORDER — AMOXICILLIN-POT CLAVULANATE 875-125 MG PO TABS
1.0000 | ORAL_TABLET | Freq: Two times a day (BID) | ORAL | 0 refills | Status: AC
Start: 2024-05-27 — End: 2024-06-03

## 2024-05-27 MED ORDER — AEROCHAMBER MV MISC: 2 refills | Status: AC

## 2024-05-27 MED ORDER — BENZONATATE 100 MG PO CAPS: 200.0000 mg | ORAL_CAPSULE | Freq: Three times a day (TID) | ORAL | 0 refills | Status: AC

## 2024-05-27 MED ORDER — IPRATROPIUM BROMIDE 0.06 % NA SOLN: 2.0000 | Freq: Four times a day (QID) | NASAL | 12 refills | Status: AC

## 2024-05-27 NOTE — Discharge Instructions (Addendum)
 Your chest x-ray did not show any evidence of pneumonia.  Based on how your lungs sound I do suspect that you have bronchitis.  The cough from bronchitis can last for several weeks.  Stop taking the doxycycline  and begin taking the Augmentin  875 mg twice daily with food for 7 days for treatment of your upper respiratory tract infection and your bronchitis.  Use the albuterol inhaler, with a spacer, and take 1 to 2 puffs every 4-6 hours as needed for shortness of breath or wheezing.  Use the Atrovent  nasal spray to help with your nasal congestion and postnasal drip.  You may instill 2 squirts in each nostril every 6 hours as needed.  Hold your azelastine nasal spray while you are taking this nasal spray.  Performing sinus irrigation can also help alleviate your mucus burden.  I recommend you use distilled water and not tap water along with the [employer] sinus rinse kit.  You may do this as often as you like to help alleviate the sinus pain and pressure.  Use the Tessalon  Perles every 8 hours during the day.  Take them with a small sip of water.  They may give you some numbness to the base of your tongue or a metallic taste in your mouth, this is normal.  Use the Promethazine  DM cough syrup at bedtime for cough and congestion.  It will make you drowsy so do not take it during the day.  Return for reevaluation or see your primary care provider for any new or worsening symptoms.

## 2024-05-27 NOTE — ED Provider Notes (Addendum)
 [facility]    CSN: [id number] Arrival date & time: 05/27/24  0808      History   Chief Complaint Chief Complaint  Patient presents with   Cough   Nasal Congestion    HPI Vicki Norris is a 83 y.o. female.   HPI  83 year old female with past medical history significant for GERD, osteoarthritis, TIA, anemia, diabetes, CVA, hypertension, and depression presents for evaluation of 1 week worth of respiratory symptoms.  She was evaluated by her PCP 5 days ago for similar symptoms and requested doxycycline , which she was prescribed.  She reports that she is not improving.  She continues to have frontal sinus pressure and pain, frontal headache, nasal discharge and a productive cough with yellow sputum, shortness of breath, and wheezing.  She denies fever.  Past Medical History:  Diagnosis Date   Anemia    COVID-19 04/2021   Depression    Diabetes mellitus without complication (HCC)    GERD (gastroesophageal reflux disease)    Head ache    Hypertension    Osteoarthritis    Stroke [facility])    TIA   TIA (transient ischemic attack)     Patient Active Problem List   Diagnosis Date Noted   Nonspecific syndrome suggestive of viral illness 05/01/2023   Suspected exposure to mold 05/01/2023   COVID-19 04/2021   Acute pain of right knee 01/22/2018   Osteopenia 09/11/2017   Presbyesophagus 09/11/2017   Primary osteoarthritis of right knee 10/26/2016   Other constipation 06/19/2016   Hypercholesterolemia 06/08/2016   New onset type 2 diabetes mellitus (HCC) 06/08/2016   Vitamin B 12 deficiency 06/08/2016   Recurrent major depressive disorder 04/14/2016   Headache 09/20/2015   Depression 01/07/2014   GERD (gastroesophageal reflux disease) 01/07/2014    Past Surgical History:  Procedure Laterality Date   ABDOMINAL HYSTERECTOMY     CATARACT EXTRACTION Bilateral    CHOLECYSTECTOMY     COLONOSCOPY     COLONOSCOPY WITH PROPOFOL  N/A 11/01/2017   Procedure:  COLONOSCOPY WITH PROPOFOL ;  Surgeon: Gaylyn Gladis PENNER, MD;  Location: [facility] ENDOSCOPY;  Service: Endoscopy;  Laterality: N/A;   COLONOSCOPY WITH PROPOFOL  N/A 01/03/2021   Procedure: COLONOSCOPY WITH PROPOFOL ;  Surgeon: Maryruth Ole DASEN, MD;  Location: [facility] ENDOSCOPY;  Service: Endoscopy;  Laterality: N/A;   ESOPHAGOGASTRODUODENOSCOPY     ESOPHAGOGASTRODUODENOSCOPY (EGD) WITH PROPOFOL  N/A 06/29/2016   Procedure: ESOPHAGOGASTRODUODENOSCOPY (EGD) WITH PROPOFOL ;  Surgeon: Gladis PENNER Gaylyn, MD;  Location: [facility] ENDOSCOPY;  Service: Endoscopy;  Laterality: N/A;   ESOPHAGOGASTRODUODENOSCOPY (EGD) WITH PROPOFOL  N/A 11/01/2017   Procedure: ESOPHAGOGASTRODUODENOSCOPY (EGD) WITH PROPOFOL ;  Surgeon: Gaylyn Gladis PENNER, MD;  Location: [facility] ENDOSCOPY;  Service: Endoscopy;  Laterality: N/A;   ESOPHAGOGASTRODUODENOSCOPY (EGD) WITH PROPOFOL  N/A 01/03/2021   Procedure: ESOPHAGOGASTRODUODENOSCOPY (EGD) WITH PROPOFOL ;  Surgeon: Maryruth Ole DASEN, MD;  Location: [facility] ENDOSCOPY;  Service: Endoscopy;  Laterality: N/A;   HERNIA REPAIR     ROTATOR CUFF REPAIR     TUBAL LIGATION      OB History   No obstetric history on file.      Home Medications    Prior to Admission medications   Medication Sig Start Date End Date Taking? Authorizing Provider  albuterol (VENTOLIN HFA) 108 (90 Base) MCG/ACT inhaler Inhale 2 puffs into the lungs every 4 (four) hours as needed. 05/27/24  Yes Bernardino Ditch, NP  amoxicillin -clavulanate (AUGMENTIN ) 875-125 MG tablet Take 1 tablet by mouth every 12 (twelve) hours for 7 days. 05/27/24 06/03/24 Yes Bernardino Ditch,  NP  aspirin 81 MG chewable tablet Chew by mouth.   Yes [provider]  benzonatate  (TESSALON ) 100 MG capsule Take 2 capsules (200 mg total) by mouth every 8 (eight) hours. 05/27/24  Yes Bernardino Ditch, NP  cholecalciferol (VITAMIN D) 1000 units tablet Take 1,000 Units by mouth 2 (two) times daily.    Yes [provider]  cyanocobalamin (VITAMIN B12) 1000  MCG tablet Take 1 tablet by mouth daily.   Yes [provider]  empagliflozin (JARDIANCE) 10 MG TABS tablet Take 1 tablet by mouth daily. 01/03/24  Yes [provider]  fluticasone  (FLONASE ) 50 MCG/ACT nasal spray Place 1 spray into both nostrils daily. 07/13/22  Yes Arloa Ozell BIRCH, PA-C  glipiZIDE (GLUCOTROL) 10 MG tablet Take by mouth.   Yes [provider]  ipratropium (ATROVENT ) 0.06 % nasal spray Place 2 sprays into both nostrils 4 (four) times daily. 05/27/24  Yes Bernardino Ditch, NP  metFORMIN (GLUCOPHAGE-XR) 500 MG 24 hr tablet Take 500 mg by mouth 2 (two) times daily.   Yes [provider]  promethazine -dextromethorphan (PROMETHAZINE -DM) 6.25-15 MG/5ML syrup Take 5 mLs by mouth 4 (four) times daily as needed. 05/27/24  Yes Bernardino Ditch, NP  Spacer/Aero-Holding Chambers (AEROCHAMBER MV) inhaler Use as instructed 05/27/24  Yes Bernardino Ditch, NP  ACCU-CHEK AVIVA PLUS test strip 1 each 3 (three) times daily. Last check, 12-23-2022, HS-Result 202 10/24/20   [provider]  Alcohol Swabs (ALCOHOL PREP) 70 % PADS Use to check blood sugar twice daily. 12/14/21   [provider]  azelastine (ASTELIN) 0.1 % nasal spray Place into the nose. 11/08/20 11/08/21  [provider]  azelastine (ASTELIN) 0.1 % nasal spray 1 spray into each nostril two (2) times a day. Use in each nostril as directed 08/19/22 08/19/23  [provider]  celecoxib  (CELEBREX ) 100 MG capsule Take 1 capsule (100 mg total) by mouth 2 (two) times daily as needed for moderate pain (pain score 4-6). 01/23/24   Arvis Jolan NOVAK, PA-C  estradiol (ESTRACE) 0.1 MG/GM vaginal cream Place vaginally. 04/09/23 06/25/24  [provider]  famotidine (PEPCID) 40 MG tablet TAKE 1 TABLET BY MOUTH EVERYDAY AT BEDTIME 04/12/22   [provider]  glipiZIDE (GLUCOTROL) 5 MG tablet 2 tabs po qam and 1 tab po qpm 12/05/22   [provider]  lactulose (CHRONULAC) 10 GM/15ML  solution SMARTSIG:30 Milliliter(s) By Mouth Daily PRN 10/23/22   [provider]  Lancets MISC 2 (two) times daily. 12/14/21   [provider]  lansoprazole (PREVACID) 30 MG capsule Take 30 mg by mouth daily. 05/20/20   [provider]  Milk Thistle Extract 175 MG TABS Take by mouth.    [provider]  mometasone (NASONEX) 50 MCG/ACT nasal spray 2 sprays 2 (two) times daily. 09/18/22   [provider]  Multiple Vitamins-Minerals (MULTIVITAMIN WITH MINERALS) tablet Take 1 tablet by mouth daily.    [provider]  pantoprazole (PROTONIX) 20 MG tablet Take by mouth. 12/13/22 03/13/23  [provider]  predniSONE  (DELTASONE ) 5 MG tablet Take by mouth. 07/17/22   [provider]  RABEprazole (ACIPHEX) 20 MG tablet Take 1 tablet by mouth 2 (two) times daily. 05/15/23   [provider]  repaglinide (PRANDIN) 0.5 MG tablet Take by mouth. 01/18/24   [provider]  solifenacin (VESICARE) 10 MG tablet Take 10 mg by mouth daily. 11/09/22   [provider]  solifenacin (VESICARE) 5 MG tablet Take 1 tablet by mouth  daily. 04/25/22   [provider]  sucralfate (CARAFATE) 1 GM/10ML suspension SMARTSIG:Milliliter(s) By Mouth 08/04/23   [provider]  tamsulosin  (FLOMAX ) 0.4 MG CAPS capsule Take 1 capsule (0.4 mg total) by mouth daily. 09/01/22   White, Shelba SAUNDERS, NP  XIIDRA 5 % SOLN Apply 1 drop to eye 2 (two) times daily. 05/10/23   [provider]  Esomeprazole Magnesium (NEXIUM PO) Take 22.3 mg by mouth.  09/06/19  [provider]  sodium chloride  (OCEAN) 0.65 % SOLN nasal spray Place 2 sprays into both nostrils every 2 (two) hours while awake. 09/12/15 09/06/19  Betancourt, Ellouise LABOR, NP    Family History Family History  Problem Relation Age of Onset   Breast cancer Sister 29    Social History Social History   Tobacco Use   Smoking status: Never   Smokeless tobacco: Never  Vaping  Use   Vaping status: Never Used  Substance Use Topics   Alcohol use: No    Alcohol/week: 1.0 standard drink of alcohol    Types: 1 Cans of beer per week   Drug use: No     Allergies   Bupropion, Citalopram, Simvastatin, Esomeprazole magnesium, Metformin and related, Pantoprazole, Prozac [fluoxetine], and Glipizide   Review of Systems Review of Systems  Constitutional:  Negative for fever.  HENT:  Positive for congestion and sinus pain. Negative for ear pain.   Respiratory:  Positive for cough, shortness of breath and wheezing.      Physical Exam Triage Vital Signs ED Triage Vitals  Encounter Vitals Group     BP      Girls Systolic BP Percentile      Girls Diastolic BP Percentile      Boys Systolic BP Percentile      Boys Diastolic BP Percentile      Pulse      Resp      Temp      Temp src      SpO2      Weight      Height      Head Circumference      Peak Flow      Pain Score      Pain Loc      Pain Education      Exclude from Growth Chart    No data found.  Updated Vital Signs BP 127/81 (BP Location: Left Arm)   Pulse (!) 106   Temp 98.6 F (37 C) (Oral)   Resp 16   SpO2 96%   Visual Acuity Right Eye Distance:   Left Eye Distance:   Bilateral Distance:    Right Eye Near:   Left Eye Near:    Bilateral Near:     Physical Exam Vitals and nursing note reviewed.  Constitutional:      Appearance: Normal appearance. She is not ill-appearing.  HENT:     Head: Normocephalic and atraumatic.     Right Ear: Tympanic membrane, ear canal and external ear normal. There is no impacted cerumen.     Left Ear: Tympanic membrane, ear canal and external ear normal. There is no impacted cerumen.     Nose: Congestion and rhinorrhea present.     Comments: Nasal mucosa is mildly edematous without significant erythema.  No appreciable discharge in either nare.  Patient's bilateral maxillary and frontal sinuses are tender to compression.    Mouth/Throat:     Mouth:  Mucous membranes are moist.     Pharynx: Oropharynx is clear.  No oropharyngeal exudate or posterior oropharyngeal erythema.  Cardiovascular:     Rate and Rhythm: Normal rate and regular rhythm.     Pulses: Normal pulses.     Heart sounds: Normal heart sounds. No murmur heard.    No friction rub. No gallop.  Pulmonary:     Effort: Pulmonary effort is normal.     Breath sounds: Rhonchi present. No wheezing or rales.     Comments: Fine rhonchi in upper middle lung fields bilaterally. Musculoskeletal:     Cervical back: Normal range of motion and neck supple. No tenderness.  Lymphadenopathy:     Cervical: No cervical adenopathy.  Skin:    General: Skin is warm and dry.     Capillary Refill: Capillary refill takes less than 2 seconds.     Findings: No rash.  Neurological:     General: No focal deficit present.     Mental Status: She is alert and oriented to person, place, and time.      UC Treatments / Results  Labs (all labs ordered are listed, but only abnormal results are displayed) Labs Reviewed - No data to display  EKG   Radiology DG Chest 2 View Result Date: 05/27/2024 CLINICAL DATA:  Cough with shortness of breath and wheezing for 1 week. EXAM: CHEST - 2 VIEW COMPARISON:  Radiographs 07/13/2022 and 07/20/2018. FINDINGS: The heart size and mediastinal contours are stable. Mild linear atelectasis or scarring in the right middle lobe and left lung base. No confluent airspace disease, edema, pleural effusion or pneumothorax. Mild degenerative changes of the spine without acute osseous abnormality. IMPRESSION: Mild bibasilar atelectasis or scarring. No evidence of pneumonia. Electronically Signed   By: Elsie Perone M.D.   On: 05/27/2024 09:15    Procedures Procedures (including critical care time)  Medications Ordered in UC Medications - No data to display  Initial Impression / Assessment and Plan / UC Course  I have reviewed the triage vital signs and the nursing  notes.  Pertinent labs & imaging results that were available during my care of the patient were reviewed by me and considered in my medical decision making (see chart for details).   Patient is a nontoxic-appearing 83 year old female presenting for evaluation of worsening respiratory symptoms as outlined in HPI above.  She is on doxycycline  but is now requesting amoxicillin .  She had initially requested doxycycline  from her PCP due to the fact that she was having frontal sinus pain.  She was diagnosed with sinusitis and prescribed doxycycline .  She had a respiratory panel performed at that time and was negative for COVID, influenza, or RSV.  In the exam room she is able to speak in full sentences without dyspnea or tachypnea though talking and taking a deep breath does trigger a cough.  She has fine rhonchi in her upper and middle lung fields bilaterally.  I suspect that the patient most likely has bronchitis, however, I will obtain a chest x-ray to rule out any acute cardiopulmonary pathology.  Chest x-ray independently reviewed and evaluated by me.  Impression: The lung fields are well aerated without evidence of infiltrate or effusion.  Cardiomediastinal silhouette appears normal.  Radiology overread is pending. Radiology impression states mild bibasilar atelectasis or scarring.  No evidence of pneumonia.  I will discharge patient on the diagnosis of URI with cough and congestion and bronchitis.  She is requesting that her antibiotic be switched to amoxicillin  so I will send her home on a 7-day course of Augmentin .  Additionally, I will prescribe Atrovent  nasal spray to help with her congestion and Tessalon  Perles and Promethazine  DM cough syrup for cough and congestion.  I will advise her to perform sinus irrigation to help alleviate her mucus burden.  I have given her instructions on same.   Final Clinical Impressions(s) / UC Diagnoses   Final diagnoses:  Acute cough  URI with cough and  congestion  Bronchitis, chronic obstructive w acute bronchitis (HCC)     Discharge Instructions      Your chest x-ray did not show any evidence of pneumonia.  Based on how your lungs sound I do suspect that you have bronchitis.  The cough from bronchitis can last for several weeks.  Stop taking the doxycycline  and begin taking the Augmentin  875 mg twice daily with food for 7 days for treatment of your upper respiratory tract infection and your bronchitis.  Use the albuterol inhaler, with a spacer, and take 1 to 2 puffs every 4-6 hours as needed for shortness of breath or wheezing.  Use the Atrovent  nasal spray to help with your nasal congestion and postnasal drip.  You may instill 2 squirts in each nostril every 6 hours as needed.  Hold your azelastine nasal spray while you are taking this nasal spray.  Performing sinus irrigation can also help alleviate your mucus burden.  I recommend you use distilled water and not tap water along with the [employer] sinus rinse kit.  You may do this as often as you like to help alleviate the sinus pain and pressure.  Use the Tessalon  Perles every 8 hours during the day.  Take them with a small sip of water.  They may give you some numbness to the base of your tongue or a metallic taste in your mouth, this is normal.  Use the Promethazine  DM cough syrup at bedtime for cough and congestion.  It will make you drowsy so do not take it during the day.  Return for reevaluation or see your primary care provider for any new or worsening symptoms.      ED Prescriptions     Medication Sig Dispense Auth. Provider   amoxicillin -clavulanate (AUGMENTIN ) 875-125 MG tablet Take 1 tablet by mouth every 12 (twelve) hours for 7 days. 14 tablet Bernardino Ditch, NP   benzonatate  (TESSALON ) 100 MG capsule Take 2 capsules (200 mg total) by mouth every 8 (eight) hours. 21 capsule Bernardino Ditch, NP   ipratropium (ATROVENT ) 0.06 % nasal spray Place 2 sprays into both nostrils 4  (four) times daily. 15 mL Bernardino Ditch, NP   promethazine -dextromethorphan (PROMETHAZINE -DM) 6.25-15 MG/5ML syrup Take 5 mLs by mouth 4 (four) times daily as needed. 118 mL Bernardino Ditch, NP   Spacer/Aero-Holding Chambers (AEROCHAMBER MV) inhaler Use as instructed 1 each Bernardino Ditch, NP   albuterol (VENTOLIN HFA) 108 (90 Base) MCG/ACT inhaler Inhale 2 puffs into the lungs every 4 (four) hours as needed. 18 g Bernardino Ditch, NP      PDMP not reviewed this encounter.   Bernardino Ditch, NP 05/27/24 [id number]    Bernardino Ditch, NP 05/27/24 [id number]

## 2024-05-27 NOTE — ED Triage Notes (Signed)
 Pt states she has cough and congestion X 1 week  the coughing is so bad her chest hurts when she coughs. She did see her PCP last week and was treated with doxy, she has 4 tablets left still. She is requesting amox instead.

## 2024-06-05 ENCOUNTER — Other Ambulatory Visit
Admission: RE | Admit: 2024-06-05 | Discharge: 2024-06-05 | Disposition: A | Discharge status: Home / Self Care | Source: Ambulatory Visit | Attending: Ophthalmology | Admitting: Ophthalmology

## 2024-06-05 ENCOUNTER — Other Ambulatory Visit: Admission: RE | Admit: 2024-06-05 | Source: Ambulatory Visit

## 2024-06-05 DIAGNOSIS — G4452 New daily persistent headache (NDPH): Secondary | ICD-10-CM | POA: Diagnosis present

## 2024-06-05 LAB — CBC
HCT: 36.9 % (ref 36.0–46.0)
Hemoglobin: 12.1 g/dL (ref 12.0–15.0)
MCH: 30.1 pg (ref 26.0–34.0)
MCHC: 32.8 g/dL (ref 30.0–36.0)
MCV: 91.8 fL (ref 80.0–100.0)
Platelets: 279 K/uL (ref 150–400)
RBC: 4.02 MIL/uL (ref 3.87–5.11)
RDW: 12.9 % (ref 11.5–15.5)
WBC: 6.4 K/uL (ref 4.0–10.5)
nRBC: 0 % (ref 0.0–0.2)

## 2024-06-05 LAB — C-REACTIVE PROTEIN: CRP: 0.7 mg/dL (ref <1.0)

## 2024-06-05 LAB — SEDIMENTATION RATE: Sed Rate: 23 mm/h (ref 0–30)

## 2024-06-09 ENCOUNTER — Other Ambulatory Visit: Payer: Self-pay | Admitting: Ophthalmology

## 2024-06-09 DIAGNOSIS — G4452 New daily persistent headache (NDPH): Secondary | ICD-10-CM

## 2024-06-19 ENCOUNTER — Ambulatory Visit
Admission: RE | Admit: 2024-06-19 | Discharge: 2024-06-19 | Disposition: A | Discharge status: Home / Self Care | Payer: Self-pay | Source: Ambulatory Visit | Attending: Ophthalmology | Admitting: Ophthalmology

## 2024-06-19 DIAGNOSIS — G4452 New daily persistent headache (NDPH): Secondary | ICD-10-CM

## 2024-06-19 MED ORDER — GADOPICLENOL 0.5 MMOL/ML IV SOLN
10.0000 mL | Freq: Once | INTRAVENOUS | Status: AC | PRN
  Administered 2024-06-19: 9 mL via INTRAVENOUS
